# Patient Record
Sex: Female | Born: 1985 | Race: Black or African American | Hispanic: No | Marital: Single | Smoking: Never smoker
Health system: Southern US, Community
[De-identification: ages and names within clinical notes are randomized; demographics above are authoritative.]

## PROBLEM LIST (undated history)

## (undated) ENCOUNTER — Emergency Department (HOSPITAL_COMMUNITY): Admission: EM | Payer: Self-pay | Source: Home / Self Care

## (undated) HISTORY — PX: TUBAL LIGATION: SHX77

---

## 2010-10-25 ENCOUNTER — Emergency Department (HOSPITAL_BASED_OUTPATIENT_CLINIC_OR_DEPARTMENT_OTHER)
Admission: EM | Admit: 2010-10-25 | Discharge: 2010-10-25 | Payer: Self-pay | Source: Home / Self Care | Admitting: Emergency Medicine

## 2010-10-28 ENCOUNTER — Ambulatory Visit
Admission: RE | Admit: 2010-10-28 | Discharge: 2010-10-28 | Payer: Self-pay | Source: Home / Self Care | Attending: Family Medicine | Admitting: Family Medicine

## 2010-10-28 DIAGNOSIS — M79609 Pain in unspecified limb: Secondary | ICD-10-CM | POA: Insufficient documentation

## 2010-11-26 NOTE — Assessment & Plan Note (Signed)
Summary: NP/LP   Vital Signs:  Patient profile:   25 year old female Height:      61 inches (154.94 cm) Weight:      118.2 pounds (53.73 kg) BMI:     22.41 Temp:     97.9 degrees F (36.61 degrees C) oral Pulse rate:   65 / minute BP sitting:   110 / 72  (right arm)  Vitals Entered By: Baxter Hire) (October 28, 2010 9:32 AM) CC: new patient / right leg,foot and knee Pain Assessment Patient in pain? yes     Location: right leg Intensity: 4 Type: tingling Nutritional Status BMI of 19 -24 = normal  Does patient need assistance? Functional Status Self care Ambulation Normal   CC:  new patient / right leg and foot and knee.  History of Present Illness: 25 yo F here with right lower leg numbness and pain  Patient reports she woke up last thursday with a sensation of numbness from right knee distally Does not recall any injury or any increase in activity. Numbness on top of foot and behind right calf mostly. Feels heavy and weak also No problems with left lower leg. No prior issues with right leg. Pain in foot is worse with driving but numbness and weakness are predominant symptoms. Did wear heels to work the day prior to this starting but she usually wears heels. Taking ibuprofen. Went to ED and had doppler u/s which was negative for a DVT. Her symptoms are much better since this day - greater than 50% improvement. Had a cold about 1-2 weeks ago. No FH of autoimmune disease/nervous system issues.  Habits & Providers  Alcohol-Tobacco-Diet     Alcohol drinks/day: occassionally     Alcohol type: wine     Tobacco Status: never  Problems Prior to Update: None  Medications Prior to Update: 1)  None  Allergies (verified): 1)  ! Pcn  Family History: + DM mother + HTN grandmother negative for heart disease  Social History: nonsmoker social drinker works at [employer] Status:  never  Physical Exam  General:   Well-developed,well-nourished,in no acute distress; alert,appropriate and cooperative throughout examination Msk:  Back: No gross deformity. No midline TTP.  Mild left paraspinal lumbar TTP.   FROM Strength 4/5 with right foot dorsiflexion and plantarflexion, knee extension and flexion.  5/5 all other BLE muscle groups. MSRs 2+ and equal in bilateral patellar and achilles tendons. sensation diminished on right from knee distally compared to left.  Right leg: No gross deformity.  No swelling, warmth, cords palpated. No focal TTP throughout lower leg. Able to do heel raises without difficulty Ambulates without limp or abnormal gait. Negative tinels over head of fibula.   Impression & Recommendations:  Problem # 1:  LEG PAIN, RIGHT (ICD-729.5) Assessment New Reassured patient regarding negative ultrasound.  Pain and numbness has also improved quite a bit over past few days and would expect this to do so.  No injury to low back and exam not consistent with radiculopathy.  No evidence of compression at fibular head on exam.  While with specific muscle testing she had some weakness through distal right leg, she was able to do heel raises.  Exam not consistent with a specific nerve or dermatomal distribution.  Only one side which makes other inflammatory neuropathies less common though still possible.  Advised patient to monitor to see if this does continue to improve over the next week.  Do not think there is an  obvious musculoskeletal cause of her numbness and pain.  Advised her to call me after a week if the same or worsening - at which point would refer to neurology for further assessment.   Orders Added: 1)  New Patient Level III [16109]

## 2011-01-04 LAB — CBC
Hemoglobin: 11.9 g/dL — ABNORMAL LOW (ref 12.0–15.0)
MCH: 26.7 pg (ref 26.0–34.0)
MCV: 80.4 fL (ref 78.0–100.0)
Platelets: 328 10*3/uL (ref 150–400)
RBC: 4.45 MIL/uL (ref 3.87–5.11)

## 2011-01-04 LAB — DIFFERENTIAL
Eosinophils Absolute: 0.1 10*3/uL (ref 0.0–0.7)
Eosinophils Relative: 2 % (ref 0–5)
Lymphs Abs: 1.5 10*3/uL (ref 0.7–4.0)
Monocytes Relative: 9 % (ref 3–12)

## 2011-01-04 LAB — BASIC METABOLIC PANEL
CO2: 24 mEq/L (ref 19–32)
Chloride: 108 mEq/L (ref 96–112)
Creatinine, Ser: 0.8 mg/dL (ref 0.4–1.2)
GFR calc Af Amer: >60 mL/min (ref >60)

## 2011-05-02 ENCOUNTER — Emergency Department (HOSPITAL_COMMUNITY)
Admission: EM | Admit: 2011-05-02 | Discharge: 2011-05-02 | Disposition: A | Discharge status: Home / Self Care | Payer: Self-pay | Attending: Emergency Medicine | Admitting: Emergency Medicine

## 2011-05-02 DIAGNOSIS — Z79899 Other long term (current) drug therapy: Secondary | ICD-10-CM | POA: Insufficient documentation

## 2011-05-02 DIAGNOSIS — E860 Dehydration: Secondary | ICD-10-CM | POA: Insufficient documentation

## 2011-05-02 DIAGNOSIS — R109 Unspecified abdominal pain: Secondary | ICD-10-CM | POA: Insufficient documentation

## 2011-05-02 DIAGNOSIS — K5900 Constipation, unspecified: Secondary | ICD-10-CM | POA: Insufficient documentation

## 2011-05-02 DIAGNOSIS — O99891 Other specified diseases and conditions complicating pregnancy: Secondary | ICD-10-CM | POA: Insufficient documentation

## 2011-05-02 LAB — URINALYSIS, ROUTINE W REFLEX MICROSCOPIC
Bilirubin Urine: NEGATIVE
Glucose, UA: NEGATIVE mg/dL
Hgb urine dipstick: NEGATIVE
Ketones, ur: 15 mg/dL — AB
Specific Gravity, Urine: 1.006 (ref 1.005–1.030)
Urobilinogen, UA: 0.2 mg/dL (ref 0.0–1.0)
pH: 6.5 (ref 5.0–8.0)

## 2011-07-25 ENCOUNTER — Emergency Department (HOSPITAL_BASED_OUTPATIENT_CLINIC_OR_DEPARTMENT_OTHER)
Admission: EM | Admit: 2011-07-25 | Discharge: 2011-07-25 | Disposition: A | Discharge status: Home / Self Care | Payer: Medicaid Other | Attending: Emergency Medicine | Admitting: Emergency Medicine

## 2011-07-25 ENCOUNTER — Encounter: Payer: Self-pay | Admitting: *Deleted

## 2011-07-25 DIAGNOSIS — R1031 Right lower quadrant pain: Secondary | ICD-10-CM | POA: Insufficient documentation

## 2011-07-25 DIAGNOSIS — O2390 Unspecified genitourinary tract infection in pregnancy, unspecified trimester: Secondary | ICD-10-CM | POA: Insufficient documentation

## 2011-07-25 DIAGNOSIS — N390 Urinary tract infection, site not specified: Secondary | ICD-10-CM

## 2011-07-25 DIAGNOSIS — O479 False labor, unspecified: Secondary | ICD-10-CM | POA: Insufficient documentation

## 2011-07-25 LAB — URINALYSIS, ROUTINE W REFLEX MICROSCOPIC
Bilirubin Urine: NEGATIVE
Specific Gravity, Urine: 1.011 (ref 1.005–1.030)
pH: 8 (ref 5.0–8.0)

## 2011-07-25 LAB — URINE MICROSCOPIC-ADD ON

## 2011-07-25 MED ORDER — ACETAMINOPHEN 325 MG PO TABS
650.0000 mg | ORAL_TABLET | Freq: Once | ORAL | Status: AC
  Administered 2011-07-25: 650 mg via ORAL
  Filled 2011-07-25: qty 2

## 2011-07-25 MED ORDER — CEPHALEXIN 500 MG PO CAPS: 500.0000 mg | ORAL_CAPSULE | Freq: Four times a day (QID) | ORAL | Status: AC

## 2011-07-25 MED ORDER — DEXTROSE 5 % IV SOLN
1.0000 g | INTRAVENOUS | Status: DC
  Administered 2011-07-25: 1 g via INTRAVENOUS
  Filled 2011-07-25: qty 1

## 2011-07-25 MED ORDER — SODIUM CHLORIDE 0.9 % IV BOLUS (SEPSIS): 1000.0000 mL | Freq: Once | INTRAVENOUS | Status: DC

## 2011-07-25 NOTE — ED Notes (Signed)
Pt states that she had  sharp lower right abd pain and one episode of vomiting  last Pm around 2000 pain resided 2 hours later, and returned at 0200 accompanied by chills

## 2011-07-25 NOTE — ED Notes (Signed)
Pt is [redacted] weeks pregnant. 

## 2011-07-25 NOTE — ED Provider Notes (Signed)
History     CSN: [id number] Arrival date & time: 07/25/2011  2:41 AM  Chief Complaint  Patient presents with  . Abdominal Pain    (Consider location/radiation/quality/duration/timing/severity/associated sxs/prior treatment) HPI Patient is a G2 P1 currently [redacted] weeks pregnant who presents with mild sharp right lower quadrant pain since this evening.  She reports nausea and vomiting earlier today.  Denies hematemesis.  She denies diarrhea fever or chills.  She reports no loss of fluid or vaginal bleeding or spotting.  She continues to feel the field the child move at this time.  Her symptoms came on and they were brought.  They come and go.  They last seconds.  Her symptoms are described as moderate in severity. nothing improves or worsens her symptoms History reviewed. No pertinent past medical history.  History reviewed. No pertinent past surgical history.  History reviewed. No pertinent family history.  History  Substance Use Topics  . Smoking status: Never Smoker   . Smokeless tobacco: Not on file  . Alcohol Use: No    OB History    Grav Para Term Preterm Abortions TAB SAB Ect Mult Living   2 1              Review of Systems  All other systems reviewed and are negative.    Allergies  Penicillins  Home Medications   Current Outpatient Rx  Name Route Sig Dispense Refill  . PRENATAL PLUS 27-1 MG PO TABS Oral Take 1 tablet by mouth daily.        BP 124/64  Pulse 95  Temp(Src) 98.6 F (37 C) (Oral)  Resp 16  SpO2 96%  Physical Exam  Constitutional: She is oriented to person, place, and time. She appears well-developed and well-nourished.  HENT:  Head: Normocephalic.  Eyes: EOM are normal.  Neck: Normal range of motion.  Cardiovascular: Normal rate.   Pulmonary/Chest: Effort normal.  Abdominal: Soft.       Gravid uterus.  No significant tenderness on exam.  No guarding or rebound or peritoneal signs.  Musculoskeletal: Normal range of motion.  Neurological:  She is alert and oriented to person, place, and time.  Skin: Skin is warm and dry.  Psychiatric: She has a normal mood and affect.    ED Course  Procedures (including critical care time)  Labs Reviewed  URINALYSIS, ROUTINE W REFLEX MICROSCOPIC - Abnormal; Notable for the following:    Appearance CLOUDY (*)    Hgb urine dipstick SMALL (*)    Protein, ur 30 (*)    Leukocytes, UA LARGE (*)    All other components within normal limits  URINE MICROSCOPIC-ADD ON - Abnormal; Notable for the following:    Bacteria, UA FEW (*)    All other components within normal limits  URINE CULTURE   No results found.   1. Urinary tract infection   2. Labor, false (Braxton-Hicks)       MDM  Low suspicion for renal colic.  Obtain a urinalysis at this time patient is then placed on the fetal monitor as well tocometer.  Your heart rates in the 140s to 150s.  She does not have evidence of contractions at this time.  Antiemetics now will bolus with 1 L of fluid.  We'll reevaluate the patient and continue to monitor fetal heart rhythm and tocometetry  5:52 AM Urinary tract infection noted.  Urine culture sent.  Patient started on IV Rocephin.  While in the emergency department she developed a temperature of 100.8.  She does appear to be contracting at this time with contractions every 4-6 minutes.  Examination of her cervix demonstrates a fingertip cervix without effacement.  We'll contact her OB/GYN for further recommendations at this time  6:04 AM Spoke with Dr Leida Lauth of [facility]. We discussed the case. He agreed with management and no indication for hospitalization. Pt has been instructed to call her OB/GYN's on Monday if she continues to have symptoms.  Her urine has been cultured she's been given a dose of ceftriaxone.  She'll be sent home on 10 day course of Keflex.       Lyanne Co, MD 07/25/11 [phone]

## 2011-07-26 ENCOUNTER — Telehealth (HOSPITAL_BASED_OUTPATIENT_CLINIC_OR_DEPARTMENT_OTHER): Payer: Self-pay | Admitting: Emergency Medicine

## 2011-07-27 LAB — URINE CULTURE

## 2012-06-29 IMAGING — US US EXTREM LOW VENOUS*R*
1 series · 14 of 17 positions shown · non-contrast
Comparison: None.

CLINICAL DATA: Right calf pain and numbness.

RIGHT LOWER EXTREMITY VENOUS DUPLEX ULTRASOUND
TECHNIQUE: Gray-scale sonography with graded compression, as well
as color Doppler and duplex ultrasound were performed to evaluate
the deep venous system of the lower extremity from the level of the
common femoral vein through the popliteal and proximal calf veins.
Spectral Doppler was utilized to evaluate flow at rest and with
distal augmentation maneuvers.

[Series 1: us extrem low venous*right* · 14 of 17 slices shown]
[im 1/17]
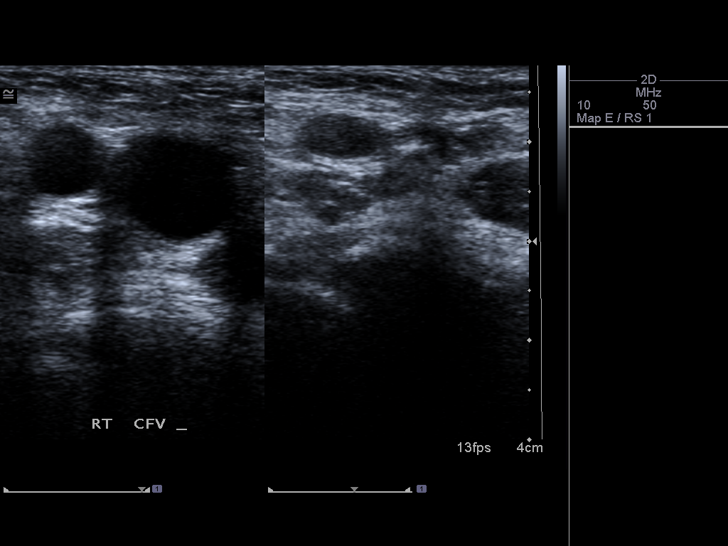
[im 2/17]
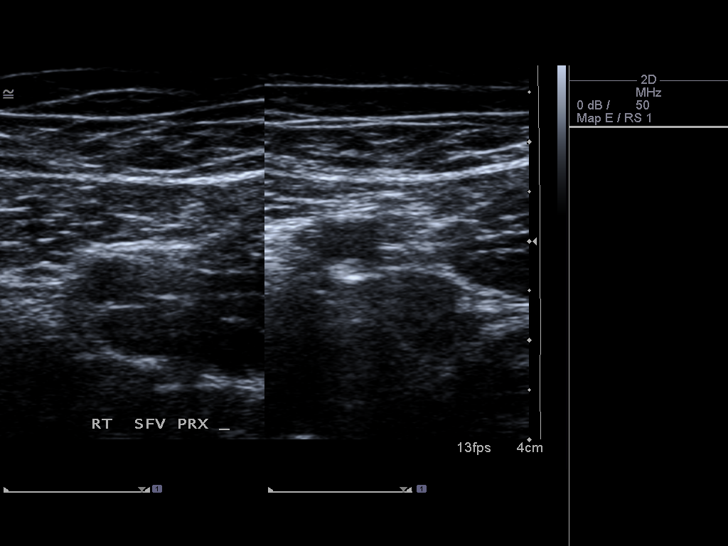
[im 4/17]
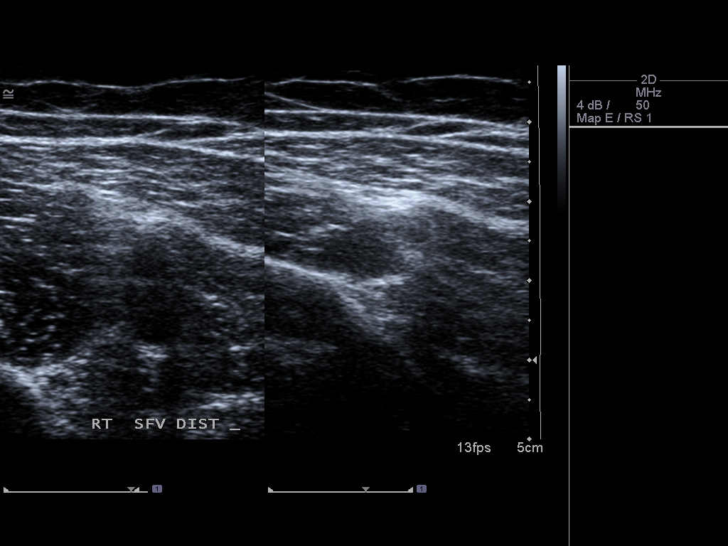
[im 5/17]
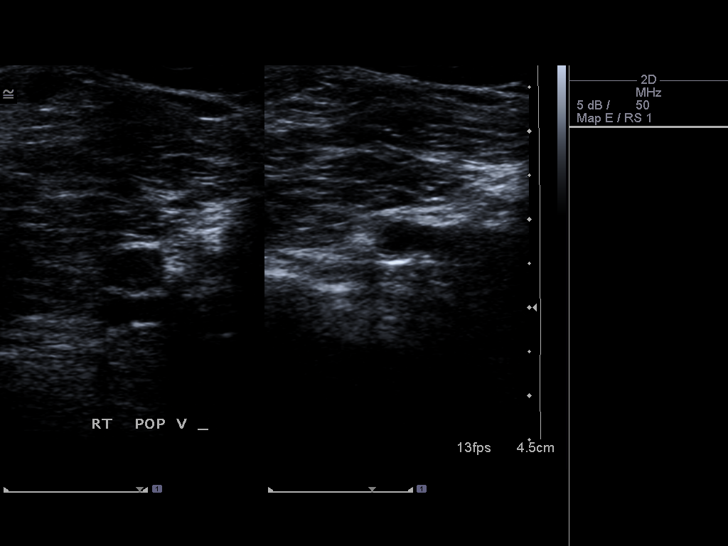
[im 6/17]
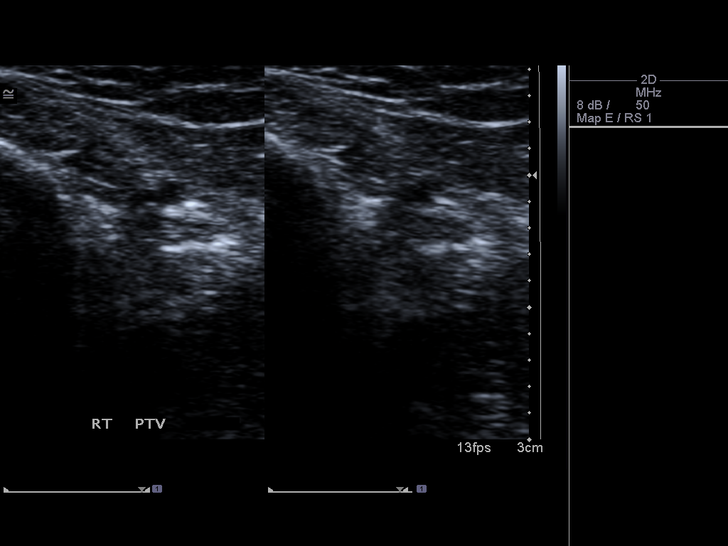
[im 7/17]
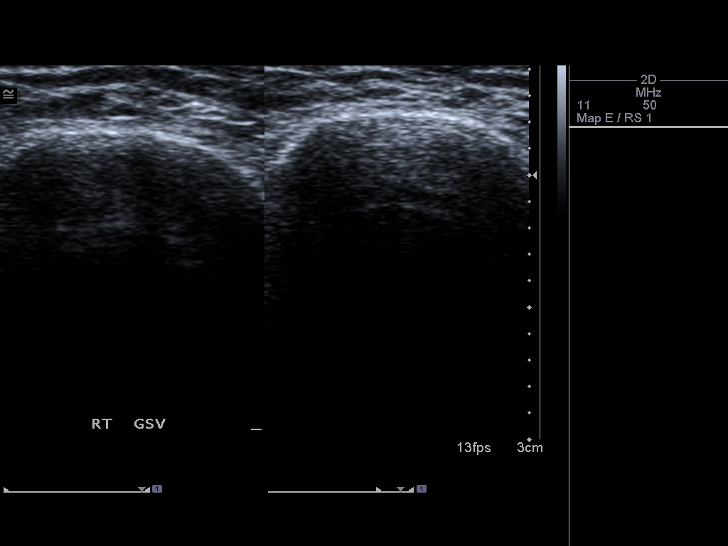
[im 8/17]
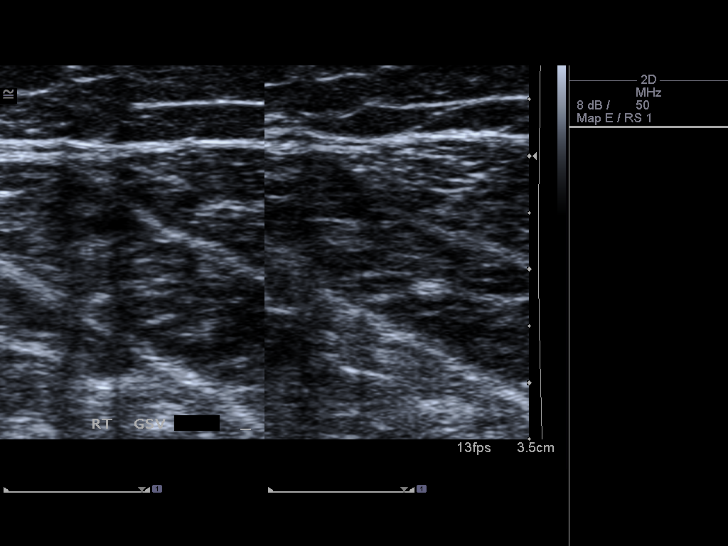
[im 10/17]
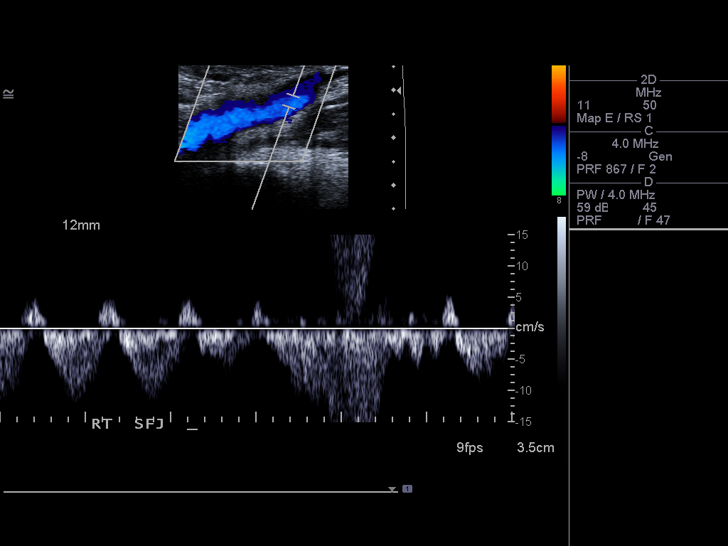
[im 11/17]
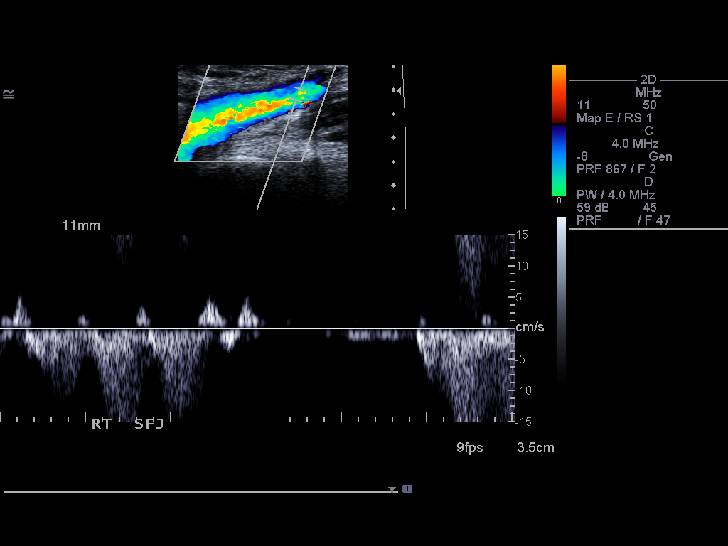
[im 12/17]
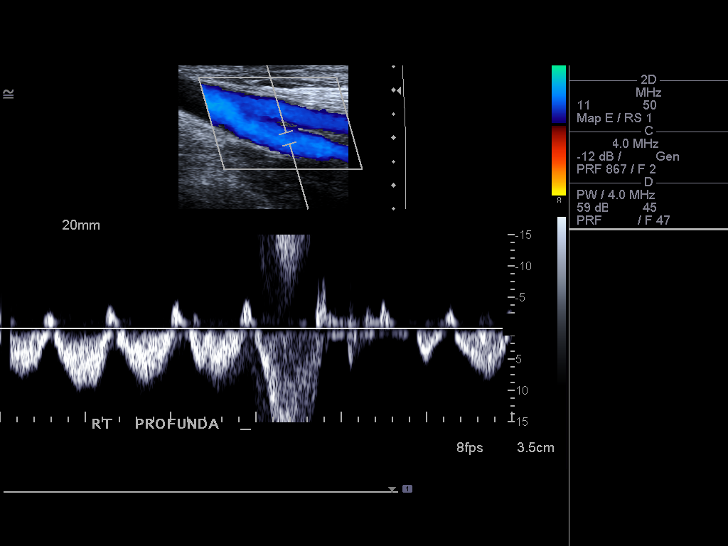
[im 13/17]
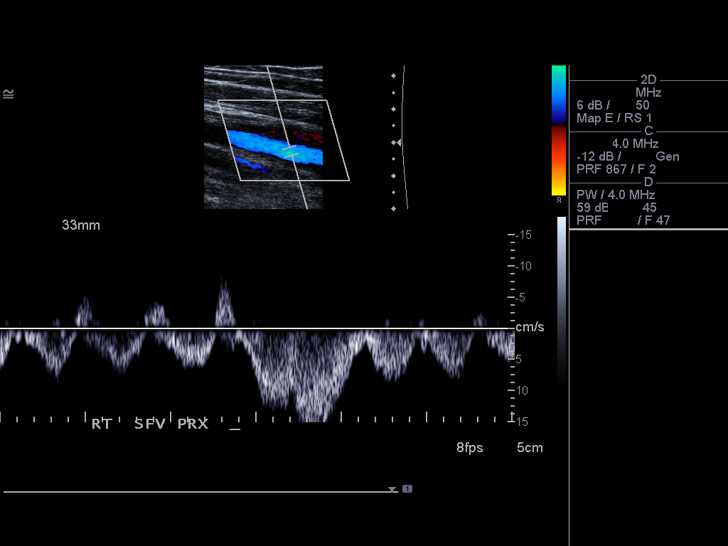
[im 14/17]
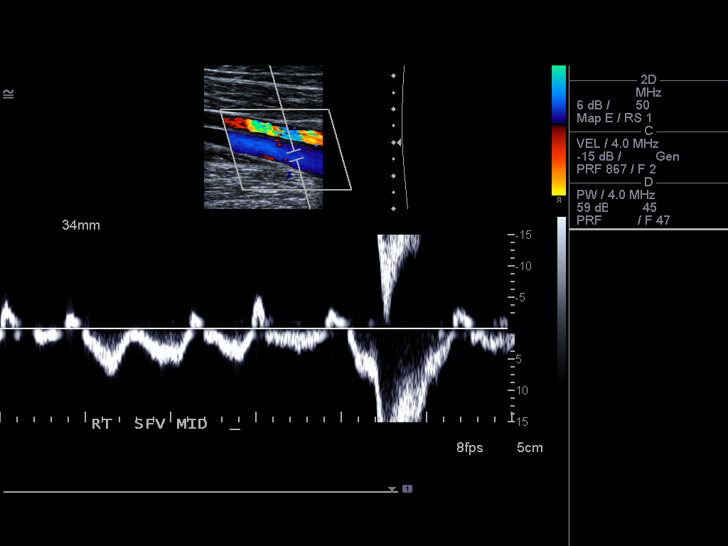
[im 16/17]
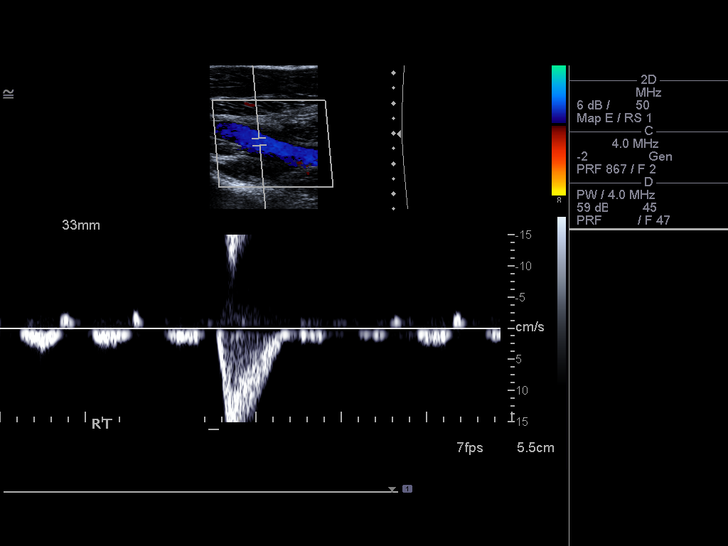
[im 17/17]
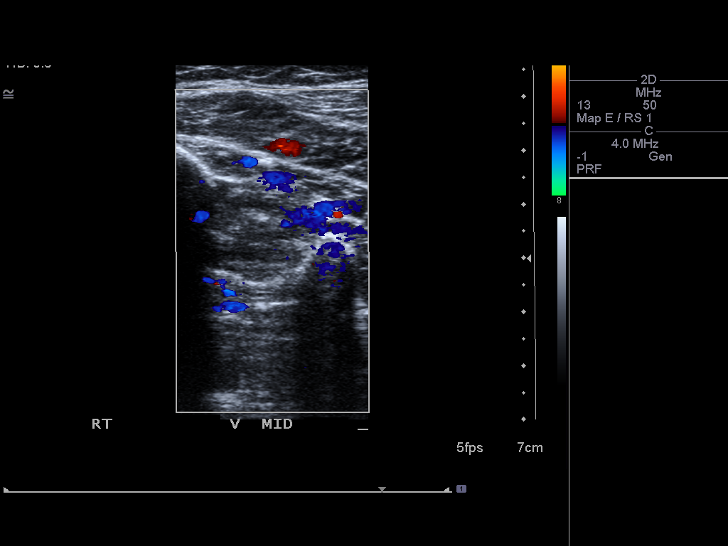

[14 of 17 positions shown; findings below may reference images not displayed]

FINDINGS: Normal compressibility of the common femoral,
superficial femoral, and popliteal veins is demonstrated, as well
as the visualized proximal calf veins.  No filling defects to
suggest DVT on grayscale or color Doppler imaging.  Doppler
waveforms show normal direction of venous flow, normal respiratory
phasicity and response to augmentation.
IMPRESSION: No evidence of right lower extremity deep vein thrombosis.

## 2014-07-09 ENCOUNTER — Encounter (HOSPITAL_BASED_OUTPATIENT_CLINIC_OR_DEPARTMENT_OTHER): Payer: Self-pay | Admitting: Emergency Medicine

## 2014-07-09 ENCOUNTER — Emergency Department (HOSPITAL_BASED_OUTPATIENT_CLINIC_OR_DEPARTMENT_OTHER)
Admission: EM | Admit: 2014-07-09 | Discharge: 2014-07-10 | Disposition: A | Discharge status: Home / Self Care | Payer: Medicaid Other | Attending: Emergency Medicine | Admitting: Emergency Medicine

## 2014-07-09 DIAGNOSIS — R509 Fever, unspecified: Secondary | ICD-10-CM | POA: Diagnosis not present

## 2014-07-09 DIAGNOSIS — R000 Tachycardia, unspecified: Secondary | ICD-10-CM | POA: Diagnosis not present

## 2014-07-09 DIAGNOSIS — Z3202 Encounter for pregnancy test, result negative: Secondary | ICD-10-CM | POA: Insufficient documentation

## 2014-07-09 DIAGNOSIS — R112 Nausea with vomiting, unspecified: Secondary | ICD-10-CM | POA: Insufficient documentation

## 2014-07-09 DIAGNOSIS — R197 Diarrhea, unspecified: Secondary | ICD-10-CM | POA: Diagnosis not present

## 2014-07-09 DIAGNOSIS — R42 Dizziness and giddiness: Secondary | ICD-10-CM | POA: Insufficient documentation

## 2014-07-09 DIAGNOSIS — Z880 Allergy status to penicillin: Secondary | ICD-10-CM | POA: Diagnosis not present

## 2014-07-09 LAB — URINALYSIS, ROUTINE W REFLEX MICROSCOPIC
GLUCOSE, UA: NEGATIVE mg/dL
KETONES UR: 15 mg/dL — AB
LEUKOCYTES UA: NEGATIVE
Nitrite: NEGATIVE
PROTEIN: 30 mg/dL — AB
Specific Gravity, Urine: 1.035 — ABNORMAL HIGH (ref 1.005–1.030)
UROBILINOGEN UA: 1 mg/dL (ref 0.0–1.0)
pH: 6 (ref 5.0–8.0)

## 2014-07-09 LAB — URINE MICROSCOPIC-ADD ON

## 2014-07-09 LAB — PREGNANCY, URINE: Preg Test, Ur: NEGATIVE

## 2014-07-09 MED ORDER — ONDANSETRON HCL 4 MG/2ML IJ SOLN
4.0000 mg | Freq: Once | INTRAMUSCULAR | Status: AC
  Administered 2014-07-09: 4 mg via INTRAVENOUS
  Filled 2014-07-09: qty 2

## 2014-07-09 MED ORDER — ACETAMINOPHEN 325 MG PO TABS
650.0000 mg | ORAL_TABLET | Freq: Once | ORAL | Status: AC
  Administered 2014-07-09: 650 mg via ORAL
  Filled 2014-07-09: qty 2

## 2014-07-09 MED ORDER — ONDANSETRON HCL 4 MG PO TABS: 4.0000 mg | ORAL_TABLET | Freq: Four times a day (QID) | ORAL | Status: AC

## 2014-07-09 MED ORDER — SODIUM CHLORIDE 0.9 % IV BOLUS (SEPSIS)
1000.0000 mL | Freq: Once | INTRAVENOUS | Status: AC
  Administered 2014-07-09: 1000 mL via INTRAVENOUS

## 2014-07-09 NOTE — Discharge Instructions (Signed)
Return to the ED with any concerns including vomiting and not able to keep down liquids, abdominal pain- especialy if it localizes to the right lower abdomen, fainting, decreased level of alertness/lethargy, or any other alarming symptoms

## 2014-07-09 NOTE — ED Notes (Signed)
Woke with dizziness. Vomiting, diarrhea, chills, fever 103.

## 2014-07-09 NOTE — ED Notes (Signed)
Pt discharged to home with family. NAD.  

## 2014-07-09 NOTE — ED Provider Notes (Signed)
CSN: [id number]     Arrival date & time 07/09/14  1926 History  This chart was scribed for Carrie Chick, MD by Roxy Cedar, ED Scribe. This patient was seen in room MH02/MH02 and the patient's care was started at 10:13 PM.   Chief Complaint  Patient presents with  . Dizziness   Patient is a 28 y.o. female presenting with dizziness and fever. The history is provided by the patient. No language interpreter was used.  Dizziness Severity:  Mild Onset quality:  Gradual Duration:  1 day Timing:  Sporadic Associated symptoms: diarrhea and vomiting   Fever Max temp prior to arrival:  103 Temp source:  Oral Severity:  Moderate Onset quality:  Sudden Duration:  1 day Associated symptoms: chills, diarrhea and vomiting     HPI Comments: Tarae Wooden is a 28 y.o. female who presents to the Emergency Department complaining of dizziness, chills, fever, nausea and vomiting that began at 6 AM this morning. She states that she has had 4 episodes of emesis today. She states that the vomit was initially clear and progressively turned green with each episode of emesis. Patient states that her maximum fever today was 103 degrees F. Patient also complains of body aches to her stomach, back and head. She states that she took ibuprofen at 3 PM today and was able to tolerate it without vomiting. She is able to keep down water. Patient denies any sick contacts or recent travel.  History reviewed. No pertinent past medical history. Past Surgical History  Procedure Laterality Date  . Tubal ligation     No family history on file. History  Substance Use Topics  . Smoking status: Never Smoker   . Smokeless tobacco: Not on file  . Alcohol Use: No   OB History   Grav Para Term Preterm Abortions TAB SAB Ect Mult Living   2 1             Review of Systems  Constitutional: Positive for fever and chills.  Gastrointestinal: Positive for vomiting and diarrhea.  Neurological: Positive for  dizziness.  All other systems reviewed and are negative.  Allergies  Penicillins  Home Medications   Prior to Admission medications   Medication Sig Start Date End Date Taking? Authorizing Provider  ondansetron (ZOFRAN) 4 MG tablet Take 1 tablet (4 mg total) by mouth every 6 (six) hours. 07/09/14   Carrie Chick, MD  prenatal vitamin w/FE, FA (PRENATAL 1 + 1) 27-1 MG TABS Take 1 tablet by mouth daily.      Historical Provider, MD   Triage Vitals: BP 128/80  Pulse 112  Temp(Src) 100.2 F (37.9 C) (Oral)  Resp 20  Ht  (1.549 m)  Wt 138 lb (62.596 kg)  BMI 26.09 kg/m2  SpO2 98%  LMP 07/08/2014  Breastfeeding? Unknown  Physical Exam  Nursing note and vitals reviewed. Constitutional: She is oriented to person, place, and time. She appears well-developed and well-nourished. No distress.  HENT:  Head: Normocephalic and atraumatic.  Mouth/Throat: Oropharynx is clear and moist.  Eyes: Conjunctivae and EOM are normal. Pupils are equal, round, and reactive to light.  Neck: Neck supple. No tracheal deviation present.  Cardiovascular: Normal rate.   Pulmonary/Chest: Effort normal. No respiratory distress.  Brisk capillary refill.  Abdominal: There is no tenderness.  Musculoskeletal: Normal range of motion.  Neurological: She is alert and oriented to person, place, and time.  Skin: Skin is warm and dry.  Psychiatric: She has a normal  mood and affect. Her behavior is normal.  note- abdomen- soft, NT, ND, NABS, OP- MMM, Lungs- CTAB, no wheezing or rhonchi ED Course  Procedures (including critical care time)  DIAGNOSTIC STUDIES: Oxygen Saturation is 98% on RA, normal by my interpretation.    COORDINATION OF CARE: 10:15 PM- Discussed plans to order diagnostic lab work. Will give Zofran and Tylenol. Pt advised of plan for treatment and pt agrees.  Labs Review Labs Reviewed  URINALYSIS, ROUTINE W REFLEX MICROSCOPIC - Abnormal; Notable for the following:    Color, Urine AMBER  (*)    APPearance CLOUDY (*)    Specific Gravity, Urine 1.035 (*)    Hgb urine dipstick LARGE (*)    Bilirubin Urine SMALL (*)    Ketones, ur 15 (*)    Protein, ur 30 (*)    All other components within normal limits  PREGNANCY, URINE  URINE MICROSCOPIC-ADD ON   Imaging Review No results found.   EKG Interpretation None     MDM   Final diagnoses:  Nausea vomiting and diarrhea    Pt presenting with acute onset of nausea/vomiting/diarrhea today. Benign abdominal exam.  Pt appears well hydrated, she has low grade fever and mild tachycardia- which improved after IV fluids.  Pt able to tolerate po fluids.  Given rx for zofran for home use.  Doubt acute intraabdominal process warranting further evaluation or treatment in the ED at this time.  Discharged with strict return precautions.  Pt agreeable with plan.  I personally performed the services described in this documentation, which was scribed in my presence. The recorded information has been reviewed and is accurate.  Carrie Chick, MD 07/09/14 [phone]

## 2014-07-09 NOTE — ED Notes (Signed)
Water and ginger ale given to patient.

## 2014-08-26 ENCOUNTER — Encounter (HOSPITAL_BASED_OUTPATIENT_CLINIC_OR_DEPARTMENT_OTHER): Payer: Self-pay | Admitting: Emergency Medicine

## 2017-05-14 ENCOUNTER — Encounter (HOSPITAL_BASED_OUTPATIENT_CLINIC_OR_DEPARTMENT_OTHER): Payer: Self-pay | Admitting: Emergency Medicine

## 2017-05-14 ENCOUNTER — Emergency Department (HOSPITAL_BASED_OUTPATIENT_CLINIC_OR_DEPARTMENT_OTHER)
Admission: EM | Admit: 2017-05-14 | Discharge: 2017-05-14 | Disposition: A | Discharge status: Home / Self Care | Payer: Self-pay | Attending: Emergency Medicine | Admitting: Emergency Medicine

## 2017-05-14 ENCOUNTER — Emergency Department (HOSPITAL_BASED_OUTPATIENT_CLINIC_OR_DEPARTMENT_OTHER): Payer: Self-pay

## 2017-05-14 DIAGNOSIS — Z79899 Other long term (current) drug therapy: Secondary | ICD-10-CM | POA: Insufficient documentation

## 2017-05-14 DIAGNOSIS — N83201 Unspecified ovarian cyst, right side: Secondary | ICD-10-CM | POA: Insufficient documentation

## 2017-05-14 DIAGNOSIS — R102 Pelvic and perineal pain: Secondary | ICD-10-CM

## 2017-05-14 LAB — CBC
HEMATOCRIT: 31.2 % — AB (ref 36.0–46.0)
Hemoglobin: 10 g/dL — ABNORMAL LOW (ref 12.0–15.0)
MCH: 25 pg — AB (ref 26.0–34.0)
MCHC: 32.1 g/dL (ref 30.0–36.0)
MCV: 78 fL (ref 78.0–100.0)
Platelets: 266 10*3/uL (ref 150–400)
RBC: 4 MIL/uL (ref 3.87–5.11)
RDW: 16.7 % — AB (ref 11.5–15.5)
WBC: 5.4 10*3/uL (ref 4.0–10.5)

## 2017-05-14 LAB — COMPREHENSIVE METABOLIC PANEL
ALBUMIN: 3.9 g/dL (ref 3.5–5.0)
ALK PHOS: 53 U/L (ref 38–126)
ALT: 13 U/L — ABNORMAL LOW (ref 14–54)
ANION GAP: 6 (ref 5–15)
AST: 24 U/L (ref 15–41)
BILIRUBIN TOTAL: 0.1 mg/dL — AB (ref 0.3–1.2)
BUN: 14 mg/dL (ref 6–20)
CALCIUM: 8.8 mg/dL — AB (ref 8.9–10.3)
CO2: 24 mmol/L (ref 22–32)
Chloride: 107 mmol/L (ref 101–111)
Creatinine, Ser: 0.87 mg/dL (ref 0.44–1.00)
GFR calc non Af Amer: >60 mL/min (ref >60)
Glucose, Bld: 87 mg/dL (ref 65–99)
POTASSIUM: 4 mmol/L (ref 3.5–5.1)
Sodium: 137 mmol/L (ref 135–145)
TOTAL PROTEIN: 8.2 g/dL — AB (ref 6.5–8.1)

## 2017-05-14 LAB — URINALYSIS, ROUTINE W REFLEX MICROSCOPIC
Bilirubin Urine: NEGATIVE
Glucose, UA: NEGATIVE mg/dL
Hgb urine dipstick: NEGATIVE
KETONES UR: NEGATIVE mg/dL
LEUKOCYTES UA: NEGATIVE
NITRITE: NEGATIVE
PROTEIN: NEGATIVE mg/dL
Specific Gravity, Urine: 1.021 (ref 1.005–1.030)
pH: 6 (ref 5.0–8.0)

## 2017-05-14 LAB — WET PREP, GENITAL
Sperm: NONE SEEN
Trich, Wet Prep: NONE SEEN
YEAST WET PREP: NONE SEEN

## 2017-05-14 LAB — LIPASE, BLOOD: Lipase: 32 U/L (ref 11–51)

## 2017-05-14 LAB — RAPID HIV SCREEN (HIV 1/2 AB+AG)
HIV 1/2 Antibodies: NONREACTIVE
HIV-1 P24 Antigen - HIV24: NONREACTIVE

## 2017-05-14 LAB — PREGNANCY, URINE: PREG TEST UR: NEGATIVE

## 2017-05-14 MED ORDER — NAPROXEN 500 MG PO TABS: 500.0000 mg | ORAL_TABLET | Freq: Two times a day (BID) | ORAL | 0 refills | Status: AC

## 2017-05-14 NOTE — ED Notes (Signed)
EDP into room, at [facility].  ?

## 2017-05-14 NOTE — ED Notes (Signed)
No changes, pt alert, NAD, calm, interactive[facility]

## 2017-05-14 NOTE — Discharge Instructions (Signed)
We saw you in the ER for pelvic pain. We treated you for possible STD exposures. Rest of the results show: There is no evidence of Urinary tract infection and the pregnancy test is negative as well.  PLEASE PRACTICE SAFE SEX. PLEASE INFORM YOUR PARTNER TO GET CHECKED AND TREATED FOR STD AS WELL.

## 2017-05-14 NOTE — ED Notes (Signed)
Alert, NAD, calm, interactive, resps e/u, speaking in clear complete sentences, no dyspnea noted, skin W&D, c/o upper abd pain only, (denies: fever, NVD, sob, urinary or vaginal sx, dizziness or visual changes).

## 2017-05-14 NOTE — ED Notes (Signed)
lavendar tube sent to lab for CBC

## 2017-05-14 NOTE — ED Provider Notes (Signed)
[facility] Provider Note   CSN: [id number] Arrival date & time: 05/14/17  1756  By signing my name below, I, Carrie Mcdonald, attest that this documentation has been prepared under the direction and in the presence of Derwood Kaplan, MD. Electronically Signed: Deland Mcdonald, ED Scribe. 05/14/17. 8:53 PM.  History   Chief Complaint Chief Complaint  Patient presents with  . Abdominal Pain   The history is provided by the patient. No language interpreter was used.   HPI Comments: Carrie Mcdonald is a 31 y.o. female who presents to the Emergency Department complaining of gradually worsening, intermittent 4/5 central abdominal pain with associated chills and nausea that began yesterday. She also reports myalgias that began a few hours ago. The pt states that her pain radiates to her midline back. The pt has taken tylenol with inadequate relief. She has no h/x of abdominal surgeries, with the exception of a tubal ligation. She denies a PMHx of DM, HTN, and nephrolithiasis. She also denies a possibility of pregnancy, STD contraction, and recent sexual partners. The pt denies vomiting, dysuria, hematuria, vaginal bleeding, and vaginal discharge.  History reviewed. No pertinent past medical history.  Patient Active Problem List   Diagnosis Date Noted  . LEG PAIN, RIGHT 10/28/2010    Past Surgical History:  Procedure Laterality Date  . TUBAL LIGATION      OB History    Gravida Para Term Preterm AB Living   2 1           SAB TAB Ectopic Multiple Live Births                   Home Medications    Prior to Admission medications   Medication Sig Start Date End Date Taking? Authorizing Provider  naproxen (NAPROSYN) 500 MG tablet Take 1 tablet (500 mg total) by mouth 2 (two) times daily. 05/14/17   Derwood Kaplan, MD  ondansetron (ZOFRAN) 4 MG tablet Take 1 tablet (4 mg total) by mouth every 6 (six) hours. 07/09/14   Jerelyn Scott, MD  prenatal vitamin w/FE, FA  (PRENATAL 1 + 1) 27-1 MG TABS Take 1 tablet by mouth daily.      [provider]    Family History No family history on file.  Social History Social History  Substance Use Topics  . Smoking status: Never Smoker  . Smokeless tobacco: Never Used  . Alcohol use Yes     Comment: social     Allergies   Penicillins   Review of Systems Review of Systems  Gastrointestinal: Positive for abdominal pain. Negative for vomiting.  Genitourinary: Negative for dysuria.  Musculoskeletal: Positive for myalgias.     Physical Exam Updated Vital Signs BP 124/83 (BP Location: Right Arm)   Pulse (!) 54   Temp 98.9 F (37.2 C) (Oral)   Resp 17   Ht 5\' 1"  (1.549 m)   Wt 58.1 kg (128 lb)   LMP 04/16/2017   SpO2 100%   BMI 24.19 kg/m   Physical Exam  Constitutional: She is oriented to person, place, and time. She appears well-developed and well-nourished.  HENT:  Head: Normocephalic.  Mouth/Throat: Oropharynx is clear and moist.  Eyes: Pupils are equal, round, and reactive to light. EOM are normal.  Neck: Normal range of motion.  Cardiovascular: Normal rate, regular rhythm, normal heart sounds and intact distal pulses.  Exam reveals no gallop and no friction rub.   No murmur heard. Pulmonary/Chest: Effort normal and breath sounds normal. No  respiratory distress. She has no wheezes. She has no rales.  Abdominal: Bowel sounds are normal. She exhibits no distension.  Genitourinary: Uterus normal. Vaginal discharge found.  Genitourinary Comments: No cervical motion tenderness  Musculoskeletal: Normal range of motion.  Neurological: She is alert and oriented to person, place, and time.  Psychiatric: She has a normal mood and affect.  Nursing note and vitals reviewed.    ED Treatments / Results   DIAGNOSTIC STUDIES: Oxygen Saturation is 99% on RA, normal by my interpretation.   COORDINATION OF CARE: 8:28 PM-Discussed next steps with pt. Pt verbalized understanding and is  agreeable with the plan.   Labs (all labs ordered are listed, but only abnormal results are displayed) Labs Reviewed  WET PREP, GENITAL - Abnormal; Notable for the following:       Result Value   Clue Cells Wet Prep HPF POC PRESENT (*)    WBC, Wet Prep HPF POC MODERATE (*)    All other components within normal limits  COMPREHENSIVE METABOLIC PANEL - Abnormal; Notable for the following:    Calcium 8.8 (*)    Total Protein 8.2 (*)    ALT 13 (*)    Total Bilirubin 0.1 (*)    All other components within normal limits  CBC - Abnormal; Notable for the following:    Hemoglobin 10.0 (*)    HCT 31.2 (*)    MCH 25.0 (*)    RDW 16.7 (*)    All other components within normal limits  LIPASE, BLOOD  URINALYSIS, ROUTINE W REFLEX MICROSCOPIC  PREGNANCY, URINE  RAPID HIV SCREEN (HIV 1/2 AB+AG)  GC/CHLAMYDIA PROBE AMP ([place]) NOT AT [facility]    EKG  EKG Interpretation None       Radiology US Transvaginal Non-ob  Result Date: 05/14/2017 CLINICAL DATA:  Mid pelvic pain and nausea for 3 days EXAM: TRANSABDOMINAL AND TRANSVAGINAL ULTRASOUND OF PELVIS DOPPLER ULTRASOUND OF OVARIES TECHNIQUE: Both transabdominal and transvaginal ultrasound examinations of the pelvis were performed. Transabdominal technique was performed for global imaging of the pelvis including uterus, ovaries, adnexal regions, and pelvic cul-de-sac. It was necessary to proceed with endovaginal exam following the transabdominal exam to visualize the ovaries. Color and duplex Doppler ultrasound was utilized to evaluate blood flow to the ovaries. COMPARISON:  None. FINDINGS: Uterus Measurements: 9 x 5 x 5 cm. No fibroids or other mass visualized. Endometrium Thickness: 10 mm, overestimated due to small volume endometrial cavity fluid. No focal abnormality visualized. Right ovary Measurements: 42 x 35 x 39 mm. The ovary is larger due to a 3.1 cm peripherally hypervascular structure with internal mid level echoes. The margins appear  somewhat crenulated. This is likely a hemorrhagic corpus luteum. Left ovary Measurements: 28 x 19 x 17 mm. Normal appearance/no adnexal mass. Pulsed Doppler evaluation of both ovaries demonstrates normal low-resistance arterial and venous waveforms. Other findings Small volume simple appearing pelvic fluid. IMPRESSION: 1. 3.1 cm complex intra-ovarian cyst on the right, likely a hemorrhagic corpus luteum. Recommend follow-up in 6-12 weeks to document expected resolution. 2. Normal bilateral ovarian blood flow. Normal appearance of the uterus and left ovary. Electronically Signed   By: Marnee Spring M.D.   On: 05/14/2017 21:30   US Pelvis Complete  Result Date: 05/14/2017 CLINICAL DATA:  Mid pelvic pain and nausea for 3 days EXAM: TRANSABDOMINAL AND TRANSVAGINAL ULTRASOUND OF PELVIS DOPPLER ULTRASOUND OF OVARIES TECHNIQUE: Both transabdominal and transvaginal ultrasound examinations of the pelvis were performed. Transabdominal technique was performed for global imaging of  the pelvis including uterus, ovaries, adnexal regions, and pelvic cul-de-sac. It was necessary to proceed with endovaginal exam following the transabdominal exam to visualize the ovaries. Color and duplex Doppler ultrasound was utilized to evaluate blood flow to the ovaries. COMPARISON:  None. FINDINGS: Uterus Measurements: 9 x 5 x 5 cm. No fibroids or other mass visualized. Endometrium Thickness: 10 mm, overestimated due to small volume endometrial cavity fluid. No focal abnormality visualized. Right ovary Measurements: 42 x 35 x 39 mm. The ovary is larger due to a 3.1 cm peripherally hypervascular structure with internal mid level echoes. The margins appear somewhat crenulated. This is likely a hemorrhagic corpus luteum. Left ovary Measurements: 28 x 19 x 17 mm. Normal appearance/no adnexal mass. Pulsed Doppler evaluation of both ovaries demonstrates normal low-resistance arterial and venous waveforms. Other findings Small volume simple  appearing pelvic fluid. IMPRESSION: 1. 3.1 cm complex intra-ovarian cyst on the right, likely a hemorrhagic corpus luteum. Recommend follow-up in 6-12 weeks to document expected resolution. 2. Normal bilateral ovarian blood flow. Normal appearance of the uterus and left ovary. Electronically Signed   By: Marnee SpringJonathon  Watts M.D.   On: 05/14/2017 21:30   [place] Art/ven Flow Abd Pelv Doppler  Result Date: 05/14/2017 CLINICAL DATA:  Mid pelvic pain and nausea for 3 days EXAM: TRANSABDOMINAL AND TRANSVAGINAL ULTRASOUND OF PELVIS DOPPLER ULTRASOUND OF OVARIES TECHNIQUE: Both transabdominal and transvaginal ultrasound examinations of the pelvis were performed. Transabdominal technique was performed for global imaging of the pelvis including uterus, ovaries, adnexal regions, and pelvic cul-de-sac. It was necessary to proceed with endovaginal exam following the transabdominal exam to visualize the ovaries. Color and duplex Doppler ultrasound was utilized to evaluate blood flow to the ovaries. COMPARISON:  None. FINDINGS: Uterus Measurements: 9 x 5 x 5 cm. No fibroids or other mass visualized. Endometrium Thickness: 10 mm, overestimated due to small volume endometrial cavity fluid. No focal abnormality visualized. Right ovary Measurements: 42 x 35 x 39 mm. The ovary is larger due to a 3.1 cm peripherally hypervascular structure with internal mid level echoes. The margins appear somewhat crenulated. This is likely a hemorrhagic corpus luteum. Left ovary Measurements: 28 x 19 x 17 mm. Normal appearance/no adnexal mass. Pulsed Doppler evaluation of both ovaries demonstrates normal low-resistance arterial and venous waveforms. Other findings Small volume simple appearing pelvic fluid. IMPRESSION: 1. 3.1 cm complex intra-ovarian cyst on the right, likely a hemorrhagic corpus luteum. Recommend follow-up in 6-12 weeks to document expected resolution. 2. Normal bilateral ovarian blood flow. Normal appearance of the uterus and left  ovary. Electronically Signed   By: Marnee SpringJonathon  Watts M.D.   On: 05/14/2017 21:30    Procedures Procedures (including critical care time)  Medications Ordered in ED Medications - No data to display   Initial Impression / Assessment and Plan / ED Course  I have reviewed the triage vital signs and the nursing notes.  Pertinent labs & imaging results that were available during my care of the patient were reviewed by me and considered in my medical decision making (see chart for details).     PT with pelvic pain; She her benign pelvic exam, US is showing cyst. Pt however does indicate that her boyfriend cheated on her 2 months ago, and she would like to be covered for STD and screened for HIV.    Final Clinical Impressions(s) / ED Diagnoses   Final diagnoses:  Pelvic pain  Pelvic pain in female  Cyst of right ovary    New Prescriptions New  Prescriptions   NAPROXEN (NAPROSYN) 500 MG TABLET    Take 1 tablet (500 mg total) by mouth 2 (two) times daily.   I personally performed the services described in this documentation, which was scribed in my presence. The recorded information has been reviewed and is accurate.     Derwood Kaplan, MD 05/14/17 [phone]

## 2017-05-14 NOTE — ED Triage Notes (Signed)
Chills and body aches since Wednesday, abd pain today. States it feels like cramps.

## 2017-05-14 NOTE — ED Notes (Signed)
Pt not in room, pt in US. CBC needs to be redrawn.

## 2017-05-16 LAB — GC/CHLAMYDIA PROBE AMP (~~LOC~~) NOT AT ARMC
Chlamydia: POSITIVE — AB
Neisseria Gonorrhea: NEGATIVE

## 2017-05-19 ENCOUNTER — Telehealth: Payer: Self-pay | Admitting: Medical

## 2017-05-19 DIAGNOSIS — A749 Chlamydial infection, unspecified: Secondary | ICD-10-CM

## 2017-05-19 MED ORDER — AZITHROMYCIN 250 MG PO TABS: 1000.0000 mg | ORAL_TABLET | Freq: Once | ORAL | 0 refills | Status: AC

## 2017-05-19 NOTE — Telephone Encounter (Addendum)
Cecille Amsterdamhanvekeyah Klindt tested positive for  Chlamydia. Patient was called by RN and allergies and pharmacy confirmed. Rx sent to pharmacy of choice.   Marny LowensteinWenzel, Julie N, PA-C 05/19/2017 1:33 PM       ----- Message from Kathe BectonLori S Berdik, RN sent at 05/19/2017  1:08 PM EDT ----- This patient tested positive for Chlamydia  She is allergic to "PCN".  I have informed the patient of her results and confirmed her pharmacy is correct in her chart. Please send Rx.   Thank you,   Kathe BectonBerdik, Lori S, RN   [facility]

## 2017-11-04 ENCOUNTER — Emergency Department (HOSPITAL_BASED_OUTPATIENT_CLINIC_OR_DEPARTMENT_OTHER)
Admission: EM | Admit: 2017-11-04 | Discharge: 2017-11-04 | Disposition: A | Discharge status: Home / Self Care | Payer: Medicaid Other | Attending: Emergency Medicine | Admitting: Emergency Medicine

## 2017-11-04 ENCOUNTER — Encounter (HOSPITAL_BASED_OUTPATIENT_CLINIC_OR_DEPARTMENT_OTHER): Payer: Self-pay | Admitting: *Deleted

## 2017-11-04 ENCOUNTER — Other Ambulatory Visit: Payer: Self-pay

## 2017-11-04 DIAGNOSIS — B001 Herpesviral vesicular dermatitis: Secondary | ICD-10-CM | POA: Insufficient documentation

## 2017-11-04 DIAGNOSIS — K1379 Other lesions of oral mucosa: Secondary | ICD-10-CM | POA: Diagnosis present

## 2017-11-04 DIAGNOSIS — Z79899 Other long term (current) drug therapy: Secondary | ICD-10-CM | POA: Diagnosis not present

## 2017-11-04 DIAGNOSIS — Z8619 Personal history of other infectious and parasitic diseases: Secondary | ICD-10-CM

## 2017-11-04 MED ORDER — VALACYCLOVIR HCL 1 G PO TABS: 1000.0000 mg | ORAL_TABLET | Freq: Three times a day (TID) | ORAL | 1 refills | Status: AC

## 2017-11-04 NOTE — Discharge Instructions (Signed)
Take medications until improving.

## 2017-11-04 NOTE — ED Provider Notes (Signed)
[facility] EMERGENCY DEPARTMENT Provider Note   CSN: [id number] Arrival date & time: 11/04/17  1102     History   Chief Complaint Chief Complaint  Patient presents with  . Mouth Lesions    HPI Carrie Mcdonald is a 32 y.o. female. CC: cold sores  HPI:  Multiple cold sores to upper lip. Prior cold sores. No pharynx lesions. Hurts to open mouth. No difficulty swallowing.   History reviewed. No pertinent past medical history.  Patient Active Problem List   Diagnosis Date Noted  . LEG PAIN, RIGHT 10/28/2010    Past Surgical History:  Procedure Laterality Date  . TUBAL LIGATION      OB History    Gravida Para Term Preterm AB Living   2 1           SAB TAB Ectopic Multiple Live Births                   Home Medications    Prior to Admission medications   Medication Sig Start Date End Date Taking? Authorizing Provider  naproxen (NAPROSYN) 500 MG tablet Take 1 tablet (500 mg total) by mouth 2 (two) times daily. 05/14/17   Derwood Kaplan, MD  ondansetron (ZOFRAN) 4 MG tablet Take 1 tablet (4 mg total) by mouth every 6 (six) hours. 07/09/14   MabeLatanya Maudlin, MD  prenatal vitamin w/FE, FA (PRENATAL 1 + 1) 27-1 MG TABS Take 1 tablet by mouth daily.      [provider]  valACYclovir (VALTREX) 1000 MG tablet Take 1 tablet (1,000 mg total) by mouth 3 (three) times daily for 7 days. 11/04/17 11/11/17  Rolland Porter, MD    Family History No family history on file.  Social History Social History   Tobacco Use  . Smoking status: Never Smoker  . Smokeless tobacco: Never Used  Substance Use Topics  . Alcohol use: Yes    Comment: social  . Drug use: No     Allergies   Penicillins   Review of Systems Review of Systems  Constitutional: Negative for appetite change, chills, diaphoresis, fatigue and fever.  HENT: Negative for mouth sores, sore throat and trouble swallowing.        Lip sores  Eyes: Negative for visual disturbance.  Respiratory:  Negative for cough, chest tightness, shortness of breath and wheezing.   Cardiovascular: Negative for chest pain.  Gastrointestinal: Negative for abdominal distention, abdominal pain, diarrhea, nausea and vomiting.  Endocrine: Negative for polydipsia, polyphagia and polyuria.  Genitourinary: Negative for dysuria, frequency and hematuria.  Musculoskeletal: Negative for gait problem.  Skin: Negative for color change, pallor and rash.  Neurological: Negative for dizziness, syncope, light-headedness and headaches.  Hematological: Does not bruise/bleed easily.  Psychiatric/Behavioral: Negative for behavioral problems and confusion.     Physical Exam Updated Vital Signs BP (!) 148/99 (BP Location: Left Arm)   Pulse 86   Temp 98.1 F (36.7 C) (Oral)   Resp 16   Ht 5\' 1"  (1.549 m)   Wt 58.1 kg (128 lb)   LMP 11/02/2017   SpO2 98%   BMI 24.19 kg/m   Physical Exam  Constitutional: She is oriented to person, place, and time. She appears well-developed and well-nourished. No distress.  HENT:  Head: Normocephalic.  Multiple lip ulcerations. NOrmal pharynx  Eyes: Conjunctivae are normal. Pupils are equal, round, and reactive to light. No scleral icterus.  Neck: Normal range of motion. Neck supple. No thyromegaly present.  Cardiovascular: Normal rate and  regular rhythm. Exam reveals no gallop and no friction rub.  No murmur heard. Pulmonary/Chest: Effort normal and breath sounds normal. No respiratory distress. She has no wheezes. She has no rales.  Abdominal: Soft. Bowel sounds are normal. She exhibits no distension. There is no tenderness. There is no rebound.  Musculoskeletal: Normal range of motion.  Neurological: She is alert and oriented to person, place, and time.  Skin: Skin is warm and dry. No rash noted.  Psychiatric: She has a normal mood and affect. Her behavior is normal.     ED Treatments / Results  Labs (all labs ordered are listed, but only abnormal results are  displayed) Labs Reviewed - No data to display  EKG  EKG Interpretation None       Radiology No results found.  Procedures Procedures (including critical care time)  Medications Ordered in ED Medications - No data to display   Initial Impression / Assessment and Plan / ED Course  I have reviewed the triage vital signs and the nursing notes.  Pertinent labs & imaging results that were available during my care of the patient were reviewed by me and considered in my medical decision making (see chart for details).  Valtrex Rx.. Dc home.   Final Clinical Impressions(s) / ED Diagnoses   Final diagnoses:  Cold sore  Hx of herpes labialis    ED Discharge Orders        Ordered    valACYclovir (VALTREX) 1000 MG tablet  3 times daily     11/04/17 1201       Rolland PorterJames, Jeric Slagel, MD 11/04/17 1207

## 2017-11-04 NOTE — ED Triage Notes (Signed)
Multiple fever blisters to her upper and lower lips since yesterday. Swelling.

## 2018-07-24 ENCOUNTER — Emergency Department (HOSPITAL_BASED_OUTPATIENT_CLINIC_OR_DEPARTMENT_OTHER)
Admission: EM | Admit: 2018-07-24 | Discharge: 2018-07-24 | Disposition: A | Discharge status: Home / Self Care | Payer: 59 | Attending: Emergency Medicine | Admitting: Emergency Medicine

## 2018-07-24 ENCOUNTER — Other Ambulatory Visit: Payer: Self-pay

## 2018-07-24 ENCOUNTER — Emergency Department (HOSPITAL_BASED_OUTPATIENT_CLINIC_OR_DEPARTMENT_OTHER): Payer: 59

## 2018-07-24 ENCOUNTER — Encounter (HOSPITAL_BASED_OUTPATIENT_CLINIC_OR_DEPARTMENT_OTHER): Payer: Self-pay | Admitting: Emergency Medicine

## 2018-07-24 DIAGNOSIS — R102 Pelvic and perineal pain: Secondary | ICD-10-CM

## 2018-07-24 DIAGNOSIS — R110 Nausea: Secondary | ICD-10-CM | POA: Diagnosis not present

## 2018-07-24 LAB — URINALYSIS, ROUTINE W REFLEX MICROSCOPIC
BILIRUBIN URINE: NEGATIVE
GLUCOSE, UA: NEGATIVE mg/dL
Hgb urine dipstick: NEGATIVE
Ketones, ur: NEGATIVE mg/dL
Nitrite: NEGATIVE
PH: 6 (ref 5.0–8.0)
Protein, ur: NEGATIVE mg/dL
SPECIFIC GRAVITY, URINE: 1.025 (ref 1.005–1.030)

## 2018-07-24 LAB — WET PREP, GENITAL
SPERM: NONE SEEN
Trich, Wet Prep: NONE SEEN
Yeast Wet Prep HPF POC: NONE SEEN

## 2018-07-24 LAB — COMPREHENSIVE METABOLIC PANEL
ALK PHOS: 58 U/L (ref 38–126)
ALT: 12 U/L (ref 0–44)
AST: 20 U/L (ref 15–41)
Albumin: 3.6 g/dL (ref 3.5–5.0)
Anion gap: 8 (ref 5–15)
BUN: 15 mg/dL (ref 6–20)
CALCIUM: 8.9 mg/dL (ref 8.9–10.3)
CHLORIDE: 103 mmol/L (ref 98–111)
CO2: 27 mmol/L (ref 22–32)
CREATININE: 0.81 mg/dL (ref 0.44–1.00)
GFR calc non Af Amer: >60 mL/min (ref >60)
GLUCOSE: 83 mg/dL (ref 70–99)
Potassium: 3.5 mmol/L (ref 3.5–5.1)
Sodium: 138 mmol/L (ref 135–145)
Total Bilirubin: 0.3 mg/dL (ref 0.3–1.2)
Total Protein: 7.7 g/dL (ref 6.5–8.1)

## 2018-07-24 LAB — PREGNANCY, URINE: PREG TEST UR: NEGATIVE

## 2018-07-24 LAB — URINALYSIS, MICROSCOPIC (REFLEX): WBC, UA: >50 WBC/hpf (ref 0–5)

## 2018-07-24 LAB — CBC WITH DIFFERENTIAL/PLATELET
BASOS ABS: 0 10*3/uL (ref 0.0–0.1)
Basophils Relative: 0 %
Eosinophils Absolute: 0.1 10*3/uL (ref 0.0–0.7)
Eosinophils Relative: 2 %
HCT: 31.9 % — ABNORMAL LOW (ref 36.0–46.0)
HEMOGLOBIN: 10 g/dL — AB (ref 12.0–15.0)
LYMPHS ABS: 1.3 10*3/uL (ref 0.7–4.0)
LYMPHS PCT: 45 %
MCH: 25.3 pg — AB (ref 26.0–34.0)
MCHC: 31.3 g/dL (ref 30.0–36.0)
MCV: 80.8 fL (ref 78.0–100.0)
Monocytes Absolute: 0.3 10*3/uL (ref 0.1–1.0)
Monocytes Relative: 12 %
NEUTROS PCT: 41 %
Neutro Abs: 1.2 10*3/uL — ABNORMAL LOW (ref 1.7–7.7)
Platelets: 308 10*3/uL (ref 150–400)
RBC: 3.95 MIL/uL (ref 3.87–5.11)
RDW: 14.2 % (ref 11.5–15.5)
WBC: 2.9 10*3/uL — AB (ref 4.0–10.5)

## 2018-07-24 LAB — LIPASE, BLOOD: Lipase: 24 U/L (ref 11–51)

## 2018-07-24 MED ORDER — CEFTRIAXONE SODIUM 250 MG IJ SOLR
250.0000 mg | Freq: Once | INTRAMUSCULAR | Status: AC
  Administered 2018-07-24: 250 mg via INTRAMUSCULAR
  Filled 2018-07-24: qty 250

## 2018-07-24 MED ORDER — ONDANSETRON 4 MG PO TBDP: 4.0000 mg | ORAL_TABLET | Freq: Three times a day (TID) | ORAL | 0 refills | Status: AC | PRN

## 2018-07-24 MED ORDER — METRONIDAZOLE 500 MG PO TABS: 500.0000 mg | ORAL_TABLET | Freq: Two times a day (BID) | ORAL | 0 refills | Status: AC

## 2018-07-24 MED ORDER — DOXYCYCLINE HYCLATE 100 MG PO CAPS: 100.0000 mg | ORAL_CAPSULE | Freq: Two times a day (BID) | ORAL | 0 refills | Status: AC

## 2018-07-24 MED FILL — ONDANSETRON ODT 4 MG TABLET: 4 | 2 days supply | Qty: 8 | Fill #0

## 2018-07-24 MED FILL — metroNIDAZOLE 500 MG TABS: 500 | 7 days supply | Qty: 14 | Fill #0

## 2018-07-24 NOTE — ED Provider Notes (Signed)
[facility] EMERGENCY DEPARTMENT Provider Note   CSN: [id number] Arrival date & time: 07/24/18  4098     History   Chief Complaint Chief Complaint  Patient presents with  . Abdominal Pain    HPI Carrie Mcdonald is a 32 y.o. female.  HPI 32 year old female with no pertinent past medical history presents to the emergency department today for evaluation of abdominal pain, pelvic pain and nausea.  Patient states that for the past 3 days she been having a "pinching sensation" in her lower abdomen that radiates to her pelvis.  She also reports some stomach discomfort with associated nausea.  Denies any change in her bowel habits, including diarrhea, melena or hematochezia.  Patient denies any urinary symptoms.  Denies any vaginal bleeding or discharge.  She states that she is sexually active with one female partner but does use protection has no concern for STD.  Patient denies any associated fevers or chills.  No known sick contacts.  No history of same pain.  Patient denies any abdominal surgeries.  She has not taken anything for her symptoms prior to arrival.  Her's pain is intermittent.  Nothing makes better or worse.  Pt denies any fever, chill, ha, vision changes, lightheadedness, dizziness, congestion, neck pain, cp, sob, cough, urinary symptoms, change in bowel habits, melena, hematochezia, lower extremity paresthesias.  History reviewed. No pertinent past medical history.  Patient Active Problem List   Diagnosis Date Noted  . LEG PAIN, RIGHT 10/28/2010    Past Surgical History:  Procedure Laterality Date  . TUBAL LIGATION       OB History    Gravida  2   Para  1   Term      Preterm      AB      Living        SAB      TAB      Ectopic      Multiple      Live Births               Home Medications    Prior to Admission medications   Medication Sig Start Date End Date Taking? Authorizing Provider  naproxen (NAPROSYN) 500 MG tablet Take  1 tablet (500 mg total) by mouth 2 (two) times daily. 05/14/17   Derwood Kaplan, MD  ondansetron (ZOFRAN) 4 MG tablet Take 1 tablet (4 mg total) by mouth every 6 (six) hours. 07/09/14   MabeLatanya Maudlin, MD  prenatal vitamin w/FE, FA (PRENATAL 1 + 1) 27-1 MG TABS Take 1 tablet by mouth daily.      [provider]    Family History No family history on file.  Social History Social History   Tobacco Use  . Smoking status: Never Smoker  . Smokeless tobacco: Never Used  Substance Use Topics  . Alcohol use: Yes    Comment: social  . Drug use: No     Allergies   Penicillins   Review of Systems Review of Systems  All other systems reviewed and are negative.    Physical Exam Updated Vital Signs BP (!) 143/105 (BP Location: Right Arm)   Pulse 75   Temp 97.8 F (36.6 C) (Oral)   Resp 16   Ht 5\' 1"  (1.549 m)   Wt 58.1 kg   LMP 07/13/2018   SpO2 100%   BMI 24.19 kg/m   Physical Exam  Constitutional: She is oriented to person, place, and time. She appears well-developed and well-nourished.  Non-toxic appearance. No distress.  HENT:  Head: Normocephalic and atraumatic.  Nose: Nose normal.  Mouth/Throat: Oropharynx is clear and moist.  Eyes: Pupils are equal, round, and reactive to light. Conjunctivae are normal. Right eye exhibits no discharge. Left eye exhibits no discharge.  Neck: Normal range of motion. Neck supple.  Cardiovascular: Normal rate, regular rhythm, normal heart sounds and intact distal pulses.  Pulmonary/Chest: Effort normal and breath sounds normal. No respiratory distress. She exhibits no tenderness.  Abdominal: Soft. Normal appearance and bowel sounds are normal. There is tenderness in the suprapubic area. There is no rigidity, no rebound, no guarding, no CVA tenderness, no tenderness at McBurney's point and negative Murphy's sign.  Genitourinary:  Genitourinary Comments: Chaperone present for exam. No external lesions, swelling, erythema, or rash  of the labia. No erythema, , bleeding, or lesions noted in the vaginal vault. Discharge noted in the vaginal vault.  CMT tenderness. No bleeding or friability. Right sided adnexal tenderness. No mass or fullness bilaterally. No inguinal adenopathy or hernia.    Musculoskeletal: Normal range of motion. She exhibits no tenderness.  Lymphadenopathy:    She has no cervical adenopathy.  Neurological: She is alert and oriented to person, place, and time.  Skin: Skin is warm and dry. Capillary refill takes less than 2 seconds.  Psychiatric: Her behavior is normal. Judgment and thought content normal.  Nursing note and vitals reviewed.    ED Treatments / Results  Labs (all labs ordered are listed, but only abnormal results are displayed) Labs Reviewed  WET PREP, GENITAL - Abnormal; Notable for the following components:      Result Value   Clue Cells Wet Prep HPF POC PRESENT (*)    WBC, Wet Prep HPF POC MODERATE (*)    All other components within normal limits  CBC WITH DIFFERENTIAL/PLATELET - Abnormal; Notable for the following components:   WBC 2.9 (*)    Hemoglobin 10.0 (*)    HCT 31.9 (*)    MCH 25.3 (*)    Neutro Abs 1.2 (*)    All other components within normal limits  URINALYSIS, ROUTINE W REFLEX MICROSCOPIC - Abnormal; Notable for the following components:   APPearance HAZY (*)    Leukocytes, UA MODERATE (*)    All other components within normal limits  URINALYSIS, MICROSCOPIC (REFLEX) - Abnormal; Notable for the following components:   Bacteria, UA MANY (*)    All other components within normal limits  URINE CULTURE  COMPREHENSIVE METABOLIC PANEL  LIPASE, BLOOD  PREGNANCY, URINE  GC/CHLAMYDIA PROBE AMP ([place]) NOT AT [facility]    EKG None  Radiology US Transvaginal Non-ob  Result Date: 07/24/2018 CLINICAL DATA:  Patient with mid abdominal and pelvic pain. EXAM: TRANSABDOMINAL AND TRANSVAGINAL ULTRASOUND OF PELVIS DOPPLER ULTRASOUND OF OVARIES TECHNIQUE: Both  transabdominal and transvaginal ultrasound examinations of the pelvis were performed. Transabdominal technique was performed for global imaging of the pelvis including uterus, ovaries, adnexal regions, and pelvic cul-de-sac. It was necessary to proceed with endovaginal exam following the transabdominal exam to visualize the adnexal structures. Color and duplex Doppler ultrasound was utilized to evaluate blood flow to the ovaries. COMPARISON:  Pelvic ultrasound 05/14/2017 FINDINGS: Uterus Measurements: 9.6 x 4.5 x 5.1 cm. No fibroids or other mass visualized. Endometrium Thickness: 9.5 mm.  No focal abnormality visualized. Right ovary Measurements: 3.2 x 1.8 x 4.0 cm. Normal appearance/no adnexal mass. Probable corpus luteum right ovary. Left ovary Measurements: 3.2 x 1.7 x 2.9 cm. Normal appearance/no adnexal mass. Pulsed  Doppler evaluation of both ovaries demonstrates normal low-resistance arterial and venous waveforms. Other findings Trace pelvic fluid. IMPRESSION: No acute process within the pelvis. Electronically Signed   By: Annia Belt M.D.   On: 07/24/2018 13:04   US Pelvis Complete  Result Date: 07/24/2018 CLINICAL DATA:  Patient with mid abdominal and pelvic pain. EXAM: TRANSABDOMINAL AND TRANSVAGINAL ULTRASOUND OF PELVIS DOPPLER ULTRASOUND OF OVARIES TECHNIQUE: Both transabdominal and transvaginal ultrasound examinations of the pelvis were performed. Transabdominal technique was performed for global imaging of the pelvis including uterus, ovaries, adnexal regions, and pelvic cul-de-sac. It was necessary to proceed with endovaginal exam following the transabdominal exam to visualize the adnexal structures. Color and duplex Doppler ultrasound was utilized to evaluate blood flow to the ovaries. COMPARISON:  Pelvic ultrasound 05/14/2017 FINDINGS: Uterus Measurements: 9.6 x 4.5 x 5.1 cm. No fibroids or other mass visualized. Endometrium Thickness: 9.5 mm.  No focal abnormality visualized. Right ovary  Measurements: 3.2 x 1.8 x 4.0 cm. Normal appearance/no adnexal mass. Probable corpus luteum right ovary. Left ovary Measurements: 3.2 x 1.7 x 2.9 cm. Normal appearance/no adnexal mass. Pulsed Doppler evaluation of both ovaries demonstrates normal low-resistance arterial and venous waveforms. Other findings Trace pelvic fluid. IMPRESSION: No acute process within the pelvis. Electronically Signed   By: Annia Belt M.D.   On: 07/24/2018 13:04   [place] Art/ven Flow Abd Pelv Doppler  Result Date: 07/24/2018 CLINICAL DATA:  Patient with mid abdominal and pelvic pain. EXAM: TRANSABDOMINAL AND TRANSVAGINAL ULTRASOUND OF PELVIS DOPPLER ULTRASOUND OF OVARIES TECHNIQUE: Both transabdominal and transvaginal ultrasound examinations of the pelvis were performed. Transabdominal technique was performed for global imaging of the pelvis including uterus, ovaries, adnexal regions, and pelvic cul-de-sac. It was necessary to proceed with endovaginal exam following the transabdominal exam to visualize the adnexal structures. Color and duplex Doppler ultrasound was utilized to evaluate blood flow to the ovaries. COMPARISON:  Pelvic ultrasound 05/14/2017 FINDINGS: Uterus Measurements: 9.6 x 4.5 x 5.1 cm. No fibroids or other mass visualized. Endometrium Thickness: 9.5 mm.  No focal abnormality visualized. Right ovary Measurements: 3.2 x 1.8 x 4.0 cm. Normal appearance/no adnexal mass. Probable corpus luteum right ovary. Left ovary Measurements: 3.2 x 1.7 x 2.9 cm. Normal appearance/no adnexal mass. Pulsed Doppler evaluation of both ovaries demonstrates normal low-resistance arterial and venous waveforms. Other findings Trace pelvic fluid. IMPRESSION: No acute process within the pelvis. Electronically Signed   By: Annia Belt M.D.   On: 07/24/2018 13:04    Procedures Procedures (including critical care time)  Medications Ordered in ED Medications  cefTRIAXone (ROCEPHIN) injection 250 mg (250 mg Intramuscular Given 07/24/18 1358)      Initial Impression / Assessment and Plan / ED Course  I have reviewed the triage vital signs and the nursing notes.  Pertinent labs & imaging results that were available during my care of the patient were reviewed by me and considered in my medical decision making (see chart for details).     Pt presents to the ED for pelvic pain. Reports nausea but denies any other associated symptoms. VS reassuring. On exam pt has some mild suprapubic abd tenderness. No other focal tenderness. Labs reassuring. Mild leukopenia of 2.9. Hgb at pt baseline. Pt has no electrolyte abnormality. Normal lipase. UA with moderate luekocytesterase with many bacteria not nitrite negative. Pt denies any urinary symptoms. Culture pending. Urine preg negative. Pelvic exam reviews moderate discharge with some cmt. Wet prep shows clue cells, and moderate wbc. Pelvic exam reassuring. Given that pt has  moderate discharge and cmt will treat for PID with rocephin and doxy. WIll give flagyl for bv. Given that pt has no uti symptoms will await for culture and treat based on culture results. Pt gc/chy pending. Dicussed with pharmacy. Pt is agreeable with the above plan. She remains hemodynamically stable. Presentation not concerning for appendicitis, diverticulitis, bowel obstruction, cholecystitis, pancreatitis, ectopic pregnancy, pyelo, kidney stone.  Pt is hemodynamically stable, in NAD, & able to ambulate in the ED. Evaluation does not show pathology that would require ongoing emergent intervention or inpatient treatment. I explained the diagnosis to the patient. Pain has been managed & has no complaints prior to dc. Pt is comfortable with above plan and is stable for discharge at this time. All questions were answered prior to disposition. Strict return precautions for f/u to the ED were discussed. Encouraged follow up with PCP.   Final Clinical Impressions(s) / ED Diagnoses   Final diagnoses:  Pelvic pain    ED Discharge  Orders         Ordered    doxycycline (VIBRAMYCIN) 100 MG capsule  2 times daily     07/24/18 1400    metroNIDAZOLE (FLAGYL) 500 MG tablet  2 times daily with meals     07/24/18 1400    ondansetron (ZOFRAN ODT) 4 MG disintegrating tablet  Every 8 hours PRN     07/24/18 1400           Wallace Keller 07/24/18 2208    Sabas Sous, MD 07/25/18 1534

## 2018-07-24 NOTE — ED Notes (Signed)
Pt placed in gown.

## 2018-07-24 NOTE — ED Triage Notes (Signed)
Pt c/o middle abd and pelvic pain since Fri; + nausea, no diarrhea; describes pain as pinching

## 2018-07-24 NOTE — Discharge Instructions (Addendum)
Have discussed your findings with you.  Concern for possible pelvic inflammatory disease.  I will treat with doxycycline and Flagyl.  Your urine culture is pending if this is some it needs to be treated you be notified.  Have given you Zofran for nausea.  May take Motrin and Tylenol for pain keep a warm compress to the pelvic area.  Please follow-up or return the ED if you develop any worsening symptoms including fevers, worsening pain, vaginal discharge, urinary symptoms or any other reason.  Letter

## 2018-07-25 LAB — GC/CHLAMYDIA PROBE AMP (~~LOC~~) NOT AT ARMC
CHLAMYDIA, DNA PROBE: NEGATIVE
NEISSERIA GONORRHEA: NEGATIVE

## 2018-07-25 LAB — URINE CULTURE

## 2018-07-26 ENCOUNTER — Telehealth: Payer: Self-pay | Admitting: *Deleted

## 2018-07-26 NOTE — Progress Notes (Signed)
ED Antimicrobial Stewardship Positive Culture Follow Up   Carrie Mcdonald is an 32 y.o. female who presented to [facility] on 07/24/2018 with a chief complaint of middle abdominal pain and pelvic pain with nausea for 3 days. Denies any urinary symptoms or fevers.  Chief Complaint  Patient presents with  . Abdominal Pain    Recent Results (from the past 720 hour(s))  Urine culture     Status: Abnormal   Collection Time: 07/24/18 10:52 AM  Result Value Ref Range Status   Specimen Description   Final    URINE, CLEAN CATCH Performed at [facility], [address] [place], [place] [address]    Special Requests   Final    NONE Performed at [facility], [address] [place], [place] [address]    Culture (A)  Final    80,000 COLONIES/mL GROUP B STREP(S.AGALACTIAE)ISOLATED TESTING AGAINST S. AGALACTIAE NOT ROUTINELY PERFORMED DUE TO PREDICTABILITY OF AMP/PEN/VAN SUSCEPTIBILITY. Performed at [facility], [address], [place], [place] [address]    Report Status 07/25/2018 FINAL  Final  Wet prep, genital     Status: Abnormal   Collection Time: 07/24/18 12:01 PM  Result Value Ref Range Status   Yeast Wet Prep HPF POC NONE SEEN NONE SEEN Final   Trich, Wet Prep NONE SEEN NONE SEEN Final   Clue Cells Wet Prep HPF POC PRESENT (A) NONE SEEN Final   WBC, Wet Prep HPF POC MODERATE (A) NONE SEEN Final   Sperm NONE SEEN  Final    Comment: Performed at [facility], [address] [place], [place] [address]   Patient discharged with doxycycline and metronidazole for treatment of PID and BV.   No therapy necessary for group B strep in urine culture. Continue current antibiotics for PID and BV.  ED Provider: Harolyn Rutherford, PA-C  Danae Orleans, PharmD 07/26/2018       9:38 AM  Clinical Pharmacist Monday - Friday phone -  [phone] Saturday - Sunday phone - [phone]

## 2018-07-26 NOTE — Telephone Encounter (Signed)
Post ED Visit - Positive Culture Follow-up  Culture report reviewed by antimicrobial stewardship pharmacist:  []  Enzo Bi, Pharm.D. []  Celedonio Miyamoto, Pharm.D., BCPS AQ-ID []  Garvin Fila, Pharm.D., BCPS []  Georgina Pillion, Pharm.D., BCPS []  [place], [place].D., BCPS, AAHIVP []  Estella Husk, Pharm.D., BCPS, AAHIVP []  Lysle Pearl, PharmD, BCPS []  Phillips Climes, PharmD, BCPS []  Agapito Games, PharmD, BCPS []  Verlan Friends, PharmD  Positive urine culture Treated with Doxycycline Hyclate, organism sensitive to the same and no further patient follow-up is required at this time.  Virl Axe [facility] 07/26/2018, 10:49 AM

## 2018-08-12 IMAGING — US US TRANSVAGINAL NON-OB
1 series · 13 of 25 positions shown · non-contrast
Comparison: None.

CLINICAL DATA: Mid pelvic pain and nausea for 3 days

EXAM:
TRANSABDOMINAL AND TRANSVAGINAL ULTRASOUND OF PELVIS
DOPPLER ULTRASOUND OF OVARIES
TECHNIQUE: Both transabdominal and transvaginal ultrasound examinations of the
pelvis were performed. Transabdominal technique was performed for
global imaging of the pelvis including uterus, ovaries, adnexal
regions, and pelvic cul-de-sac.
It was necessary to proceed with endovaginal exam following the
transabdominal exam to visualize the ovaries. Color and duplex
Doppler ultrasound was utilized to evaluate blood flow to the
ovaries.

[Series 1: us transvaginal non-ob · 0.18mm/px · 13 of 70 slices shown]
[im 1/70]
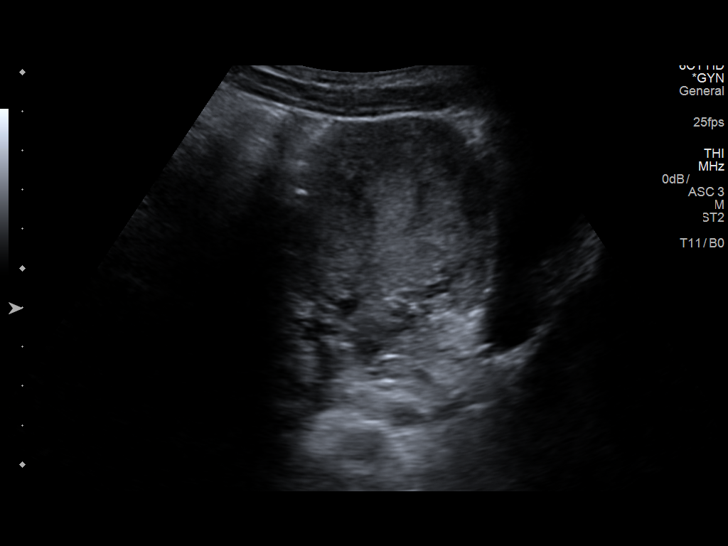
[im 6/70]
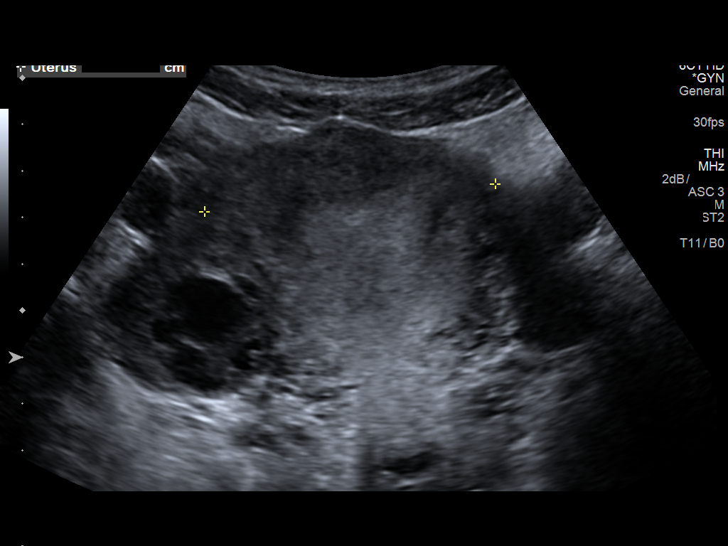
[im 12/70]
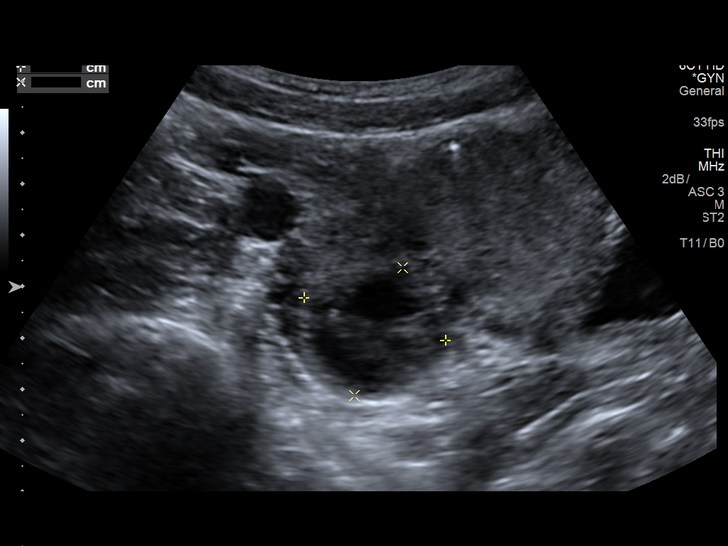
[im 18/70]
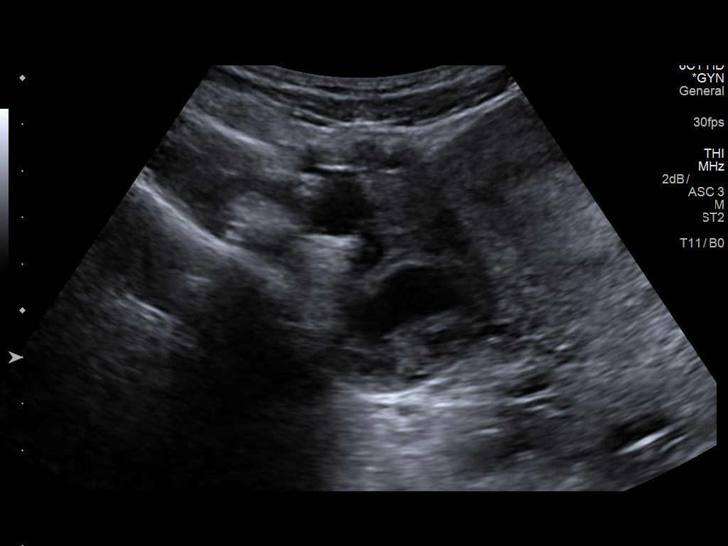
[im 24/70]
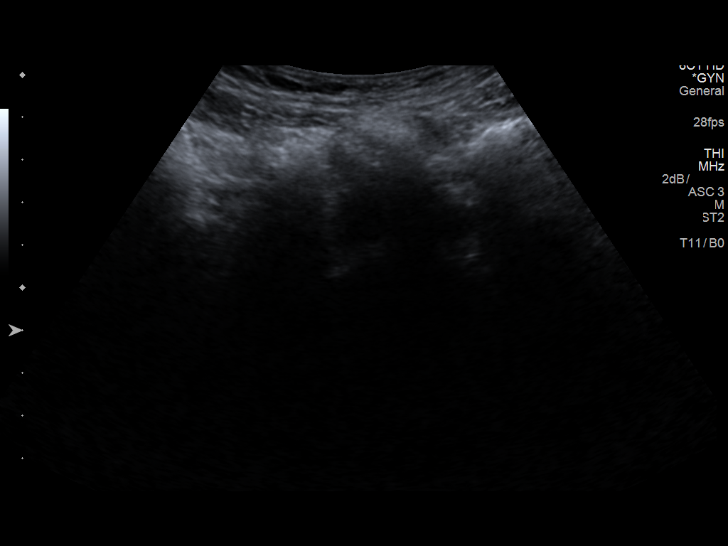
[im 29/70]
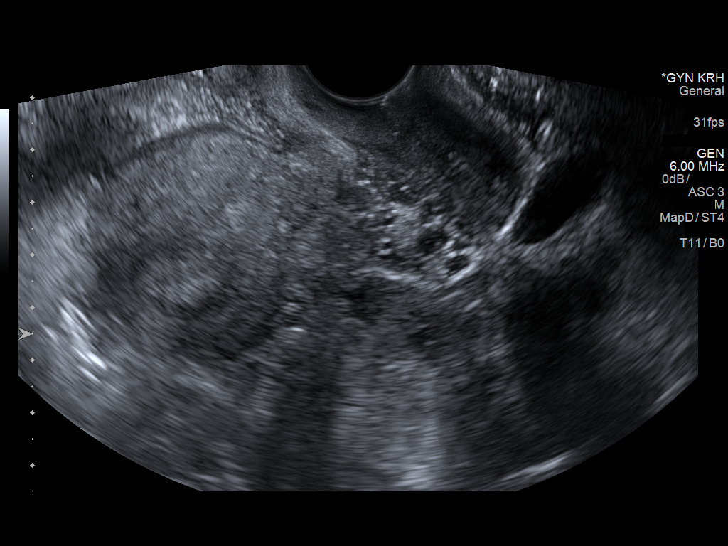
[im 35/70]
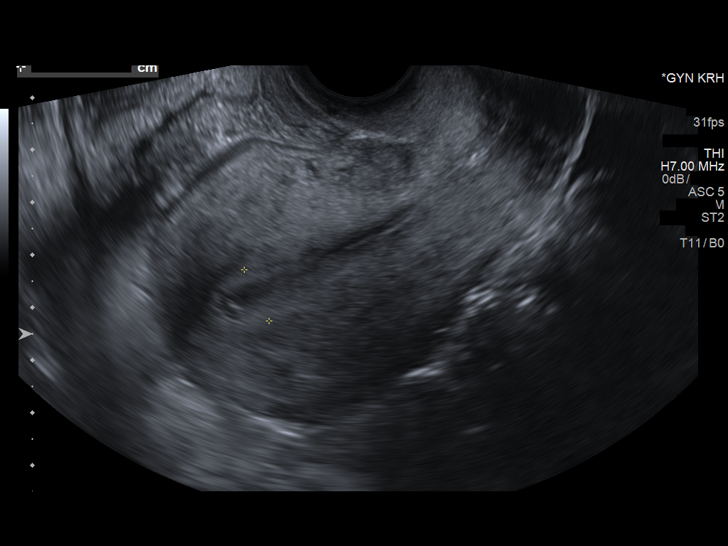
[im 41/70]
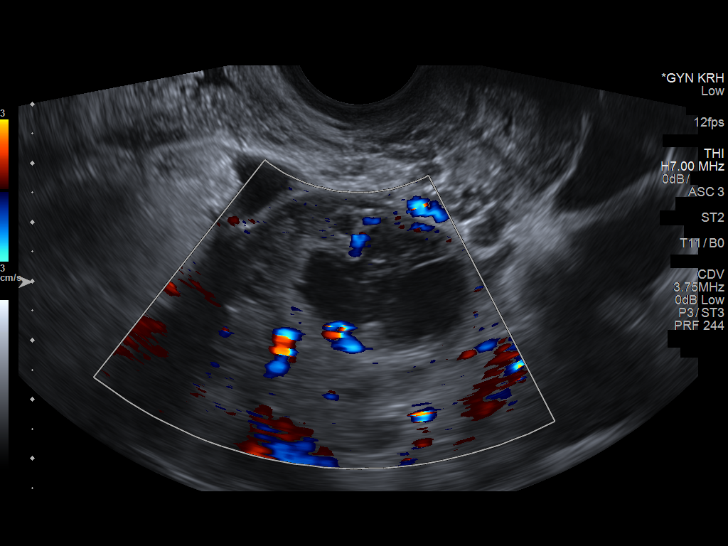
[im 47/70]
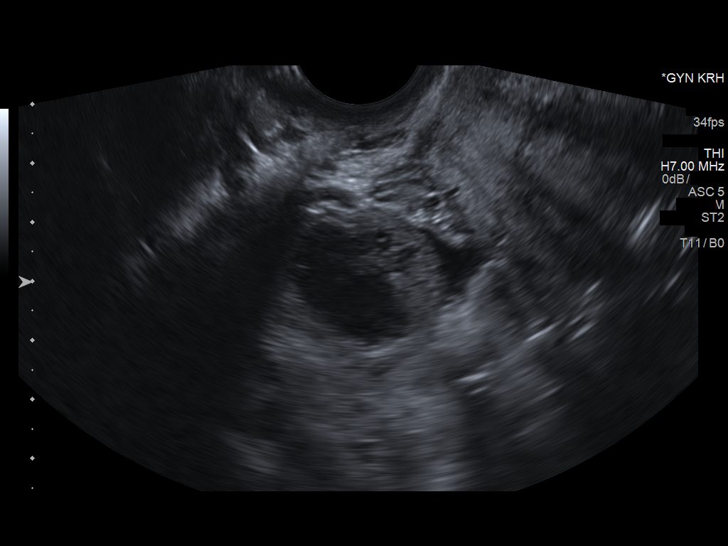
[im 52/70]
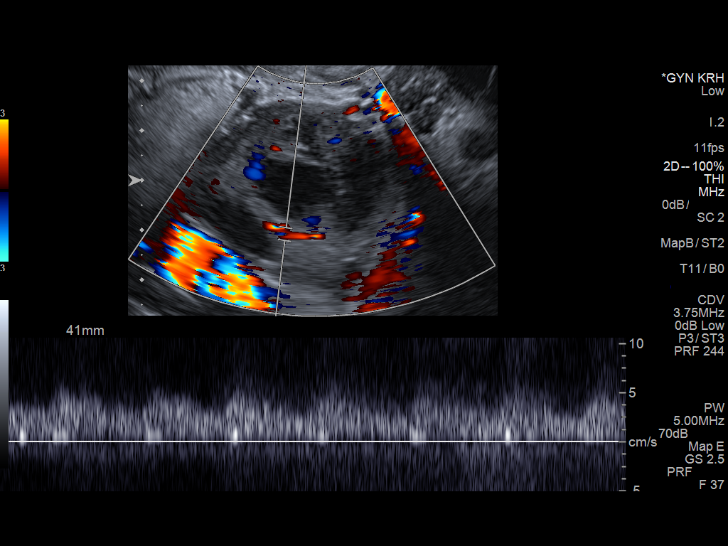
[im 58/70]
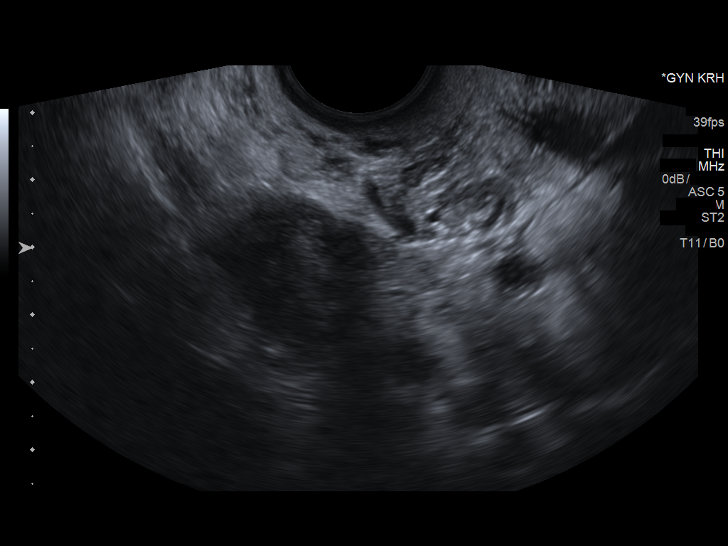
[im 64/70]
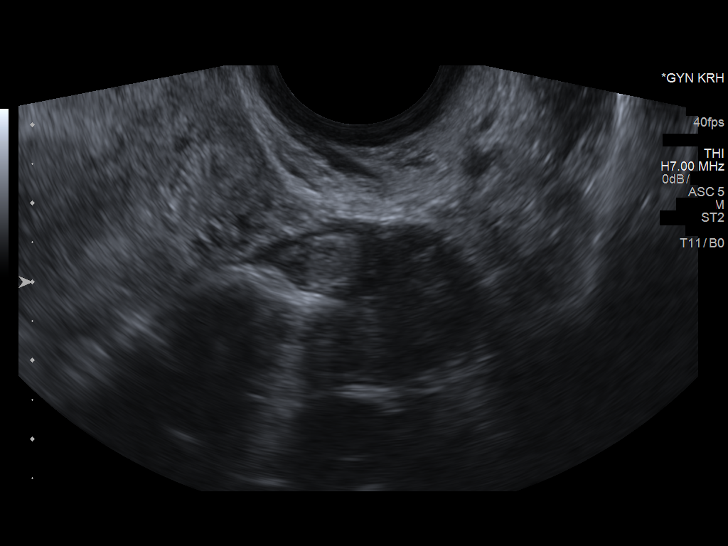
[im 70/70]
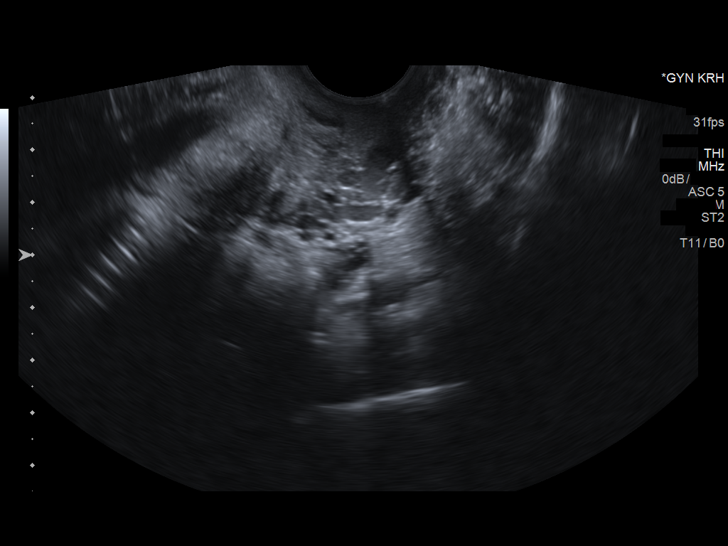

[13 of 25 positions shown; findings below may reference images not displayed]

FINDINGS: Uterus

Measurements: 9 x 5 x 5 cm. No fibroids or other mass visualized.

Endometrium

Thickness: 10 mm, overestimated due to small volume endometrial
cavity fluid. No focal abnormality visualized.

Right ovary

Measurements: 42 x 35 x 39 mm. The ovary is larger due to a 3.1 cm
peripherally hypervascular structure with internal mid level echoes.
The margins appear somewhat crenulated. This is likely a hemorrhagic
corpus luteum.

Left ovary

Measurements: 28 x 19 x 17 mm. Normal appearance/no adnexal mass.

Pulsed Doppler evaluation of both ovaries demonstrates normal
low-resistance arterial and venous waveforms.

Other findings

Small volume simple appearing pelvic fluid.
IMPRESSION: 1. 3.1 cm complex intra-ovarian cyst on the right, likely a
hemorrhagic corpus luteum. Recommend follow-up in 6-12 weeks to
document expected resolution.
2. Normal bilateral ovarian blood flow. Normal appearance of the
uterus and left ovary.

## 2018-08-15 ENCOUNTER — Encounter (HOSPITAL_BASED_OUTPATIENT_CLINIC_OR_DEPARTMENT_OTHER): Payer: Self-pay | Admitting: *Deleted

## 2018-08-15 ENCOUNTER — Emergency Department (HOSPITAL_BASED_OUTPATIENT_CLINIC_OR_DEPARTMENT_OTHER)
Admission: EM | Admit: 2018-08-15 | Discharge: 2018-08-15 | Disposition: A | Discharge status: Home / Self Care | Payer: 59 | Attending: Emergency Medicine | Admitting: Emergency Medicine

## 2018-08-15 ENCOUNTER — Emergency Department (HOSPITAL_BASED_OUTPATIENT_CLINIC_OR_DEPARTMENT_OTHER): Payer: 59

## 2018-08-15 ENCOUNTER — Other Ambulatory Visit: Payer: Self-pay

## 2018-08-15 DIAGNOSIS — D649 Anemia, unspecified: Secondary | ICD-10-CM | POA: Insufficient documentation

## 2018-08-15 DIAGNOSIS — Z3202 Encounter for pregnancy test, result negative: Secondary | ICD-10-CM | POA: Diagnosis not present

## 2018-08-15 DIAGNOSIS — Z79899 Other long term (current) drug therapy: Secondary | ICD-10-CM | POA: Diagnosis not present

## 2018-08-15 DIAGNOSIS — I10 Essential (primary) hypertension: Secondary | ICD-10-CM | POA: Diagnosis not present

## 2018-08-15 DIAGNOSIS — R072 Precordial pain: Secondary | ICD-10-CM | POA: Diagnosis not present

## 2018-08-15 DIAGNOSIS — R0602 Shortness of breath: Secondary | ICD-10-CM | POA: Insufficient documentation

## 2018-08-15 LAB — BASIC METABOLIC PANEL
Anion gap: 8 (ref 5–15)
BUN: 13 mg/dL (ref 6–20)
CO2: 26 mmol/L (ref 22–32)
Calcium: 9 mg/dL (ref 8.9–10.3)
Chloride: 102 mmol/L (ref 98–111)
Creatinine, Ser: 0.74 mg/dL (ref 0.44–1.00)
GFR calc Af Amer: >60 mL/min (ref >60)
GFR calc non Af Amer: >60 mL/min (ref >60)
Glucose, Bld: 97 mg/dL (ref 70–99)
POTASSIUM: 3.7 mmol/L (ref 3.5–5.1)
Sodium: 136 mmol/L (ref 135–145)

## 2018-08-15 LAB — CBC
HEMATOCRIT: 32.4 % — AB (ref 36.0–46.0)
HEMOGLOBIN: 9.8 g/dL — AB (ref 12.0–15.0)
MCH: 25.5 pg — ABNORMAL LOW (ref 26.0–34.0)
MCHC: 30.2 g/dL (ref 30.0–36.0)
MCV: 84.4 fL (ref 80.0–100.0)
Platelets: 367 10*3/uL (ref 150–400)
RBC: 3.84 MIL/uL — AB (ref 3.87–5.11)
RDW: 15 % (ref 11.5–15.5)
WBC: 4.7 10*3/uL (ref 4.0–10.5)
nRBC: 0 % (ref 0.0–0.2)

## 2018-08-15 LAB — TROPONIN I: Troponin I: <0.03 ng/mL (ref <0.03)

## 2018-08-15 LAB — PREGNANCY, URINE: PREG TEST UR: NEGATIVE

## 2018-08-15 NOTE — ED Provider Notes (Signed)
[facility] EMERGENCY DEPARTMENT Provider Note   CSN: [id number] Arrival date & time: 08/15/18  1826     History   Chief Complaint Chief Complaint  Patient presents with  . Chest Pain    HPI Carrie Mcdonald is a 32 y.o. female.  Patient had experienced some substernal chest pain on Sunday afternoon.  Not sure how long it lasted.  Did not experience any on Monday.  Today at around 1230 while eating lunch got some substernal nonradiating chest pain a little bit worse with deep breath.  Some slight shortness of breath.  No leg swelling.  Patient not on birth control pills.  No history of similar findings.  Patient without any cardiac risk factors.  Pain is minimal currently.  Is made worse a little bit by taking a deep breath and a little bit by movement.     History reviewed. No pertinent past medical history.  Patient Active Problem List   Diagnosis Date Noted  . LEG PAIN, RIGHT 10/28/2010    Past Surgical History:  Procedure Laterality Date  . TUBAL LIGATION     Essure     OB History    Gravida  3   Para  3   Term  3   Preterm  0   AB  0   Living  0     SAB  0   TAB  0   Ectopic  0   Multiple  0   Live Births  3            Home Medications    Prior to Admission medications   Medication Sig Start Date End Date Taking? Authorizing Provider  doxycycline (VIBRAMYCIN) 100 MG capsule Take 1 capsule (100 mg total) by mouth 2 (two) times daily. 07/24/18   Rise Mu, PA-C  metroNIDAZOLE (FLAGYL) 500 MG tablet Take 1 tablet (500 mg total) by mouth 2 (two) times daily with a meal. DO NOT CONSUME ALCOHOL WHILE TAKING THIS MEDICATION. 07/24/18   Rise Mu, PA-C  naproxen (NAPROSYN) 500 MG tablet Take 1 tablet (500 mg total) by mouth 2 (two) times daily. 05/14/17   Derwood Kaplan, MD  ondansetron (ZOFRAN ODT) 4 MG disintegrating tablet Take 1 tablet (4 mg total) by mouth every 8 (eight) hours as needed for nausea or  vomiting. 07/24/18   Leaphart, Lynann Beaver, PA-C  ondansetron (ZOFRAN) 4 MG tablet Take 1 tablet (4 mg total) by mouth every 6 (six) hours. 07/09/14   MabeLatanya Maudlin, MD  prenatal vitamin w/FE, FA (PRENATAL 1 + 1) 27-1 MG TABS Take 1 tablet by mouth daily.      [provider]    Family History No family history on file.  Social History Social History   Tobacco Use  . Smoking status: Never Smoker  . Smokeless tobacco: Never Used  Substance Use Topics  . Alcohol use: Yes    Comment: social  . Drug use: No     Allergies   Penicillins   Review of Systems Review of Systems  Constitutional: Negative for fever.  HENT: Negative for congestion.   Eyes: Negative for visual disturbance.  Respiratory: Positive for shortness of breath.   Cardiovascular: Positive for chest pain. Negative for palpitations and leg swelling.  Gastrointestinal: Negative for abdominal pain and nausea.  Genitourinary: Negative for dysuria.  Musculoskeletal: Negative for back pain.  Skin: Negative for rash.  Neurological: Negative for syncope.  Hematological: Does not bruise/bleed easily.  Psychiatric/Behavioral: Negative  for confusion.     Physical Exam Updated Vital Signs BP 120/88   Pulse 79   Temp 98.2 F (36.8 C) (Oral)   Resp 16   Ht 1.549 m (5\' 1" )   Wt 58 kg   LMP 08/14/2018   SpO2 100%   BMI 24.16 kg/m   Physical Exam  Constitutional: She is oriented to person, place, and time. She appears well-developed and well-nourished. No distress.  HENT:  Head: Normocephalic and atraumatic.  Mouth/Throat: Oropharynx is clear and moist.  Eyes: Pupils are equal, round, and reactive to light. Conjunctivae and EOM are normal.  Neck: Neck supple.  Cardiovascular: Normal rate, regular rhythm and normal heart sounds.  Pulmonary/Chest: Effort normal and breath sounds normal. No respiratory distress. She exhibits no tenderness.  Abdominal: Soft. Bowel sounds are normal. She exhibits no  distension. There is no tenderness.  Musculoskeletal: Normal range of motion. She exhibits no edema.  Neurological: She is alert and oriented to person, place, and time. No cranial nerve deficit. She exhibits normal muscle tone. Coordination normal.  Skin: Skin is warm. No rash noted.  Nursing note and vitals reviewed.    ED Treatments / Results  Labs (all labs ordered are listed, but only abnormal results are displayed) Labs Reviewed  CBC - Abnormal; Notable for the following components:      Result Value   RBC 3.84 (*)    Hemoglobin 9.8 (*)    HCT 32.4 (*)    MCH 25.5 (*)    All other components within normal limits  BASIC METABOLIC PANEL  TROPONIN I  PREGNANCY, URINE    EKG EKG Interpretation  Date/Time:  Tuesday August 15 2018 18:33:05 EDT Ventricular Rate:  68 PR Interval:  154 QRS Duration: 68 QT Interval:  396 QTC Calculation: 421 R Axis:   75 Text Interpretation:  Normal sinus rhythm with sinus arrhythmia Normal ECG No previous ECGs available Confirmed by Vanetta Mulders [phone]) on 08/15/2018 10:19:11 PM   Radiology Dg Chest 2 View  Result Date: 08/15/2018 CLINICAL DATA:  Mid chest pain and shortness of breath over the last 2 days. EXAM: CHEST - 2 VIEW COMPARISON:  None. FINDINGS: Heart size is normal. Mediastinal shadows are normal. The lungs are clear. No bronchial thickening. No infiltrate, mass, effusion or collapse. Pulmonary vascularity is normal. No bony abnormality. IMPRESSION: Normal chest Electronically Signed   By: Paulina Fusi M.D.   On: 08/15/2018 20:19    Procedures Procedures (including critical care time)  Medications Ordered in ED Medications - No data to display   Initial Impression / Assessment and Plan / ED Course  I have reviewed the triage vital signs and the nursing notes.  Pertinent labs & imaging results that were available during my care of the patient were reviewed by me and considered in my medical decision making (see chart  for details).     Work-up here chest pain symptoms since 1230 today.  Also had similar symptoms on Sunday but none on Monday.  Made a little bit worse with deep breath.  No leg swelling.  Patient was PERC negative.  D-dimer was not checked.  Patient also had some marginally elevated blood pressures will follow-up with primary care doctor for this.  And also had a little worsening of her anemia but hemoglobin was still in the 9 range.  Patient nontoxic no acute distress.  Patient without any significant cardiac risk factors.  Would recommend treatment with Motrin or Naprosyn and close follow-up with primary  care doctor for recheck of blood pressure and hemoglobin.  Final Clinical Impressions(s) / ED Diagnoses   Final diagnoses:  Precordial pain  Anemia, unspecified type  Essential hypertension    ED Discharge Orders    None       Vanetta Mulders, MD 08/15/18 2305

## 2018-08-15 NOTE — ED Triage Notes (Signed)
Chest pain all day. Tightness in the center of her chest that comes and goes.

## 2018-08-15 NOTE — ED Notes (Signed)
Pt smiling and talkative. No c/o at present.

## 2018-08-15 NOTE — Discharge Instructions (Addendum)
Today's work-up without any significant findings.  Would recommend Motrin or Naprosyn for the pain.  Make an appointment to follow-up with your doctor.  Need follow-up for blood pressure which was marginally high here today also follow-up for your anemia which is a little bit worse.  And also follow-up for the chest pain which appears to be noncardiac in nature.

## 2019-10-22 IMAGING — US US ART/VEN ABD/PELV/SCROTUM DOPPLER LTD
1 series · 13 of 25 positions shown · non-contrast
Comparison: Pelvic ultrasound 05/14/2017

CLINICAL DATA: Patient with mid abdominal and pelvic pain.

EXAM:
TRANSABDOMINAL AND TRANSVAGINAL ULTRASOUND OF PELVIS
DOPPLER ULTRASOUND OF OVARIES
TECHNIQUE: Both transabdominal and transvaginal ultrasound examinations of the
pelvis were performed. Transabdominal technique was performed for
global imaging of the pelvis including uterus, ovaries, adnexal
regions, and pelvic cul-de-sac.
It was necessary to proceed with endovaginal exam following the
transabdominal exam to visualize the adnexal structures. Color and
duplex Doppler ultrasound was utilized to evaluate blood flow to the
ovaries.

[Series 1: us art/ven abd/pelv/scrotum doppler ltd · 0.17mm/px · 13 of 121 slices shown]
[im 1/121]
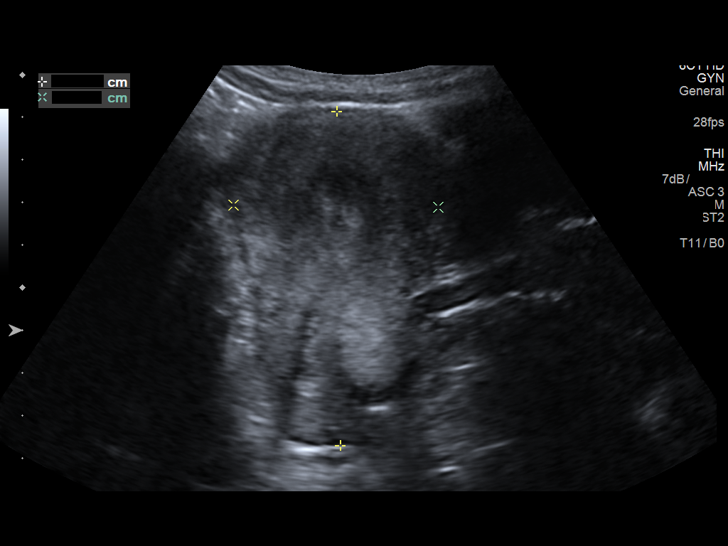
[im 11/121]
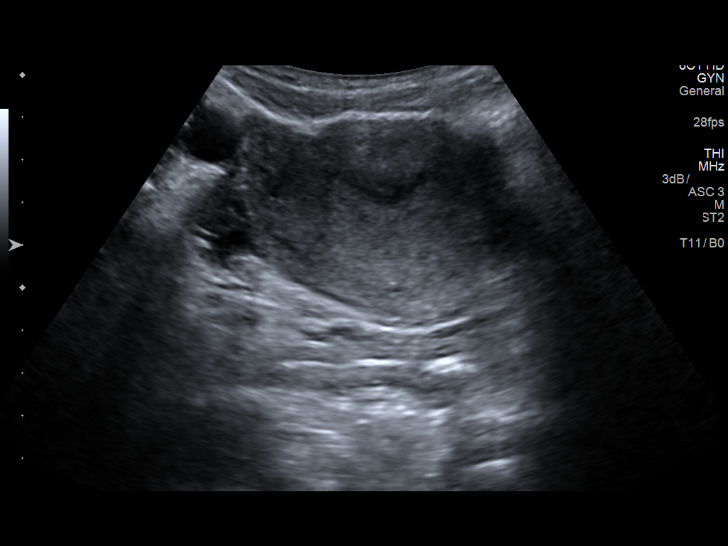
[im 21/121]
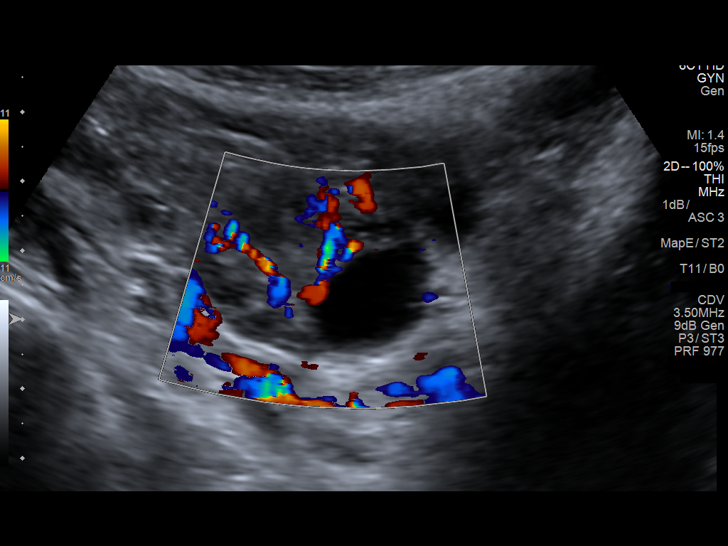
[im 31/121]
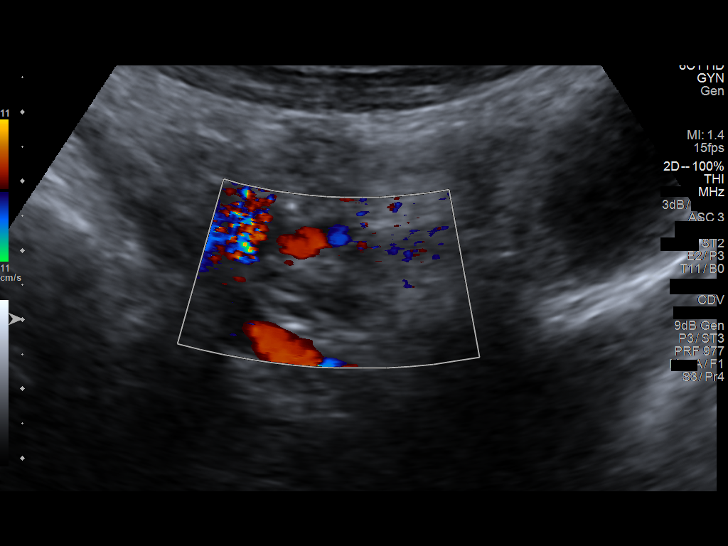
[im 41/121]
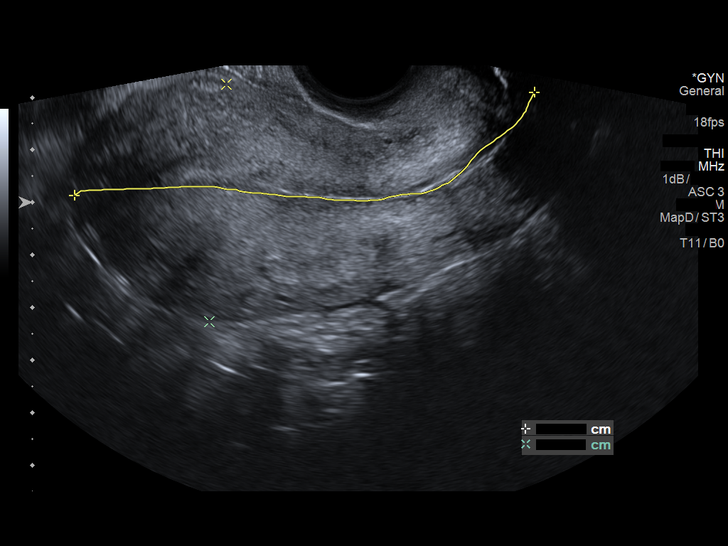
[im 51/121]
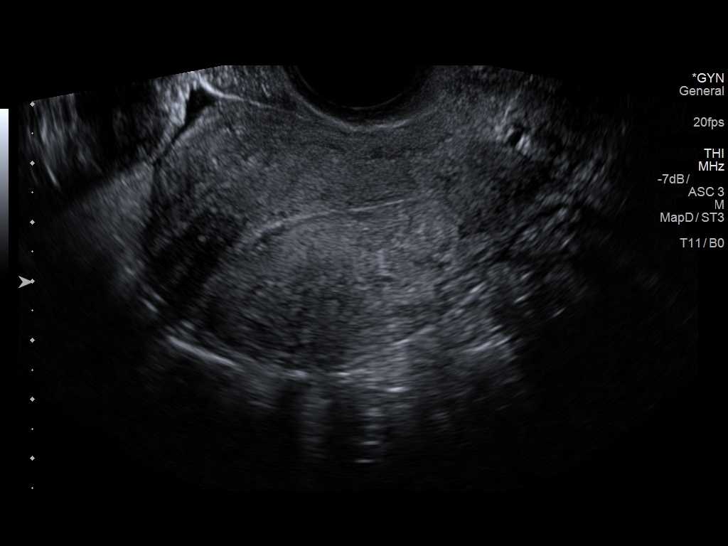
[im 61/121]
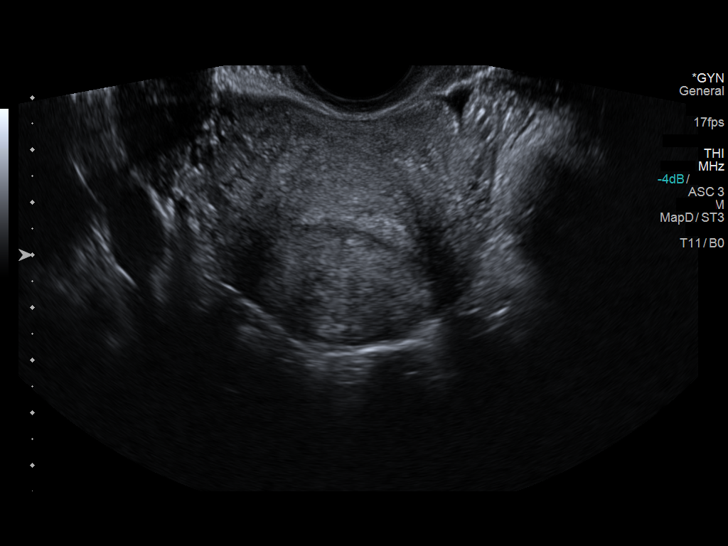
[im 71/121]
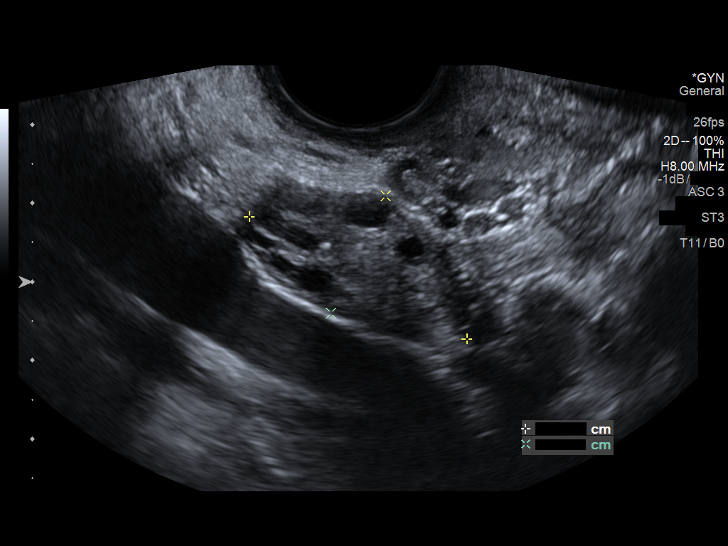
[im 81/121]
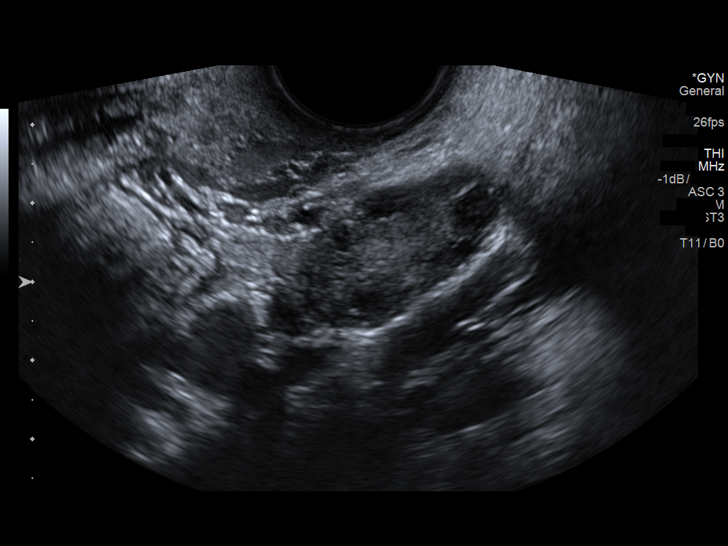
[im 91/121]
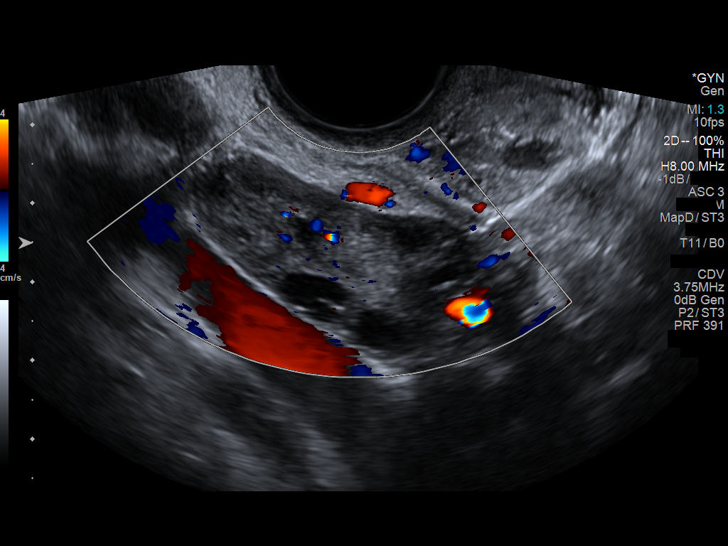
[im 101/121]
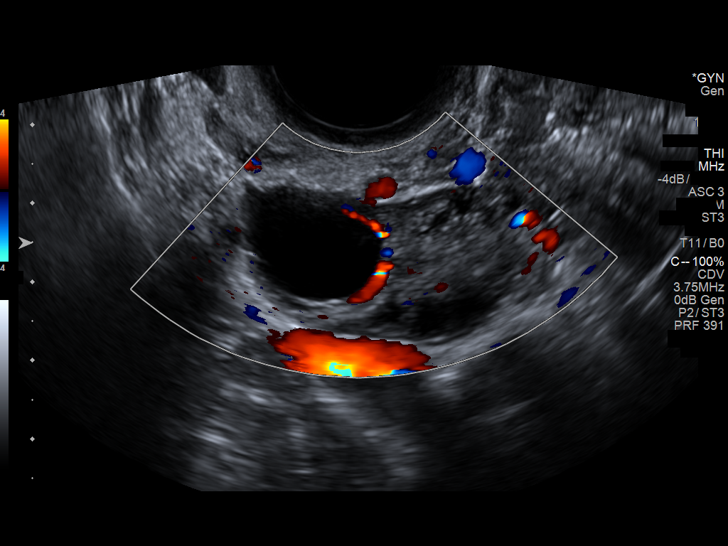
[im 111/121]
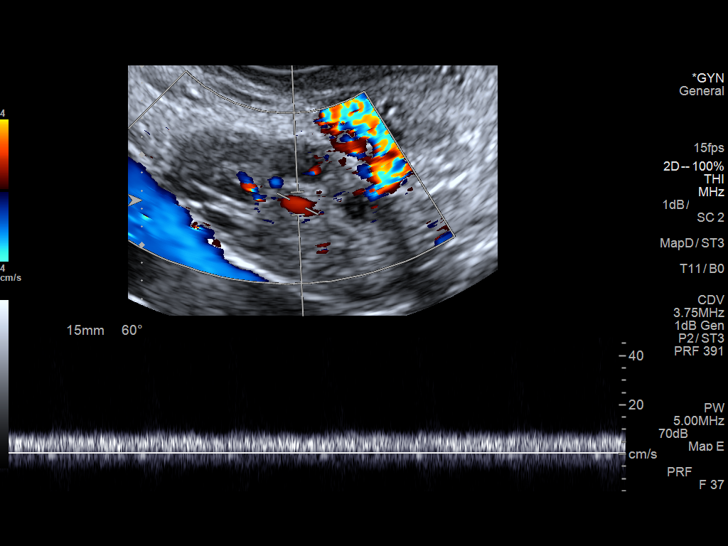
[im 121/121]
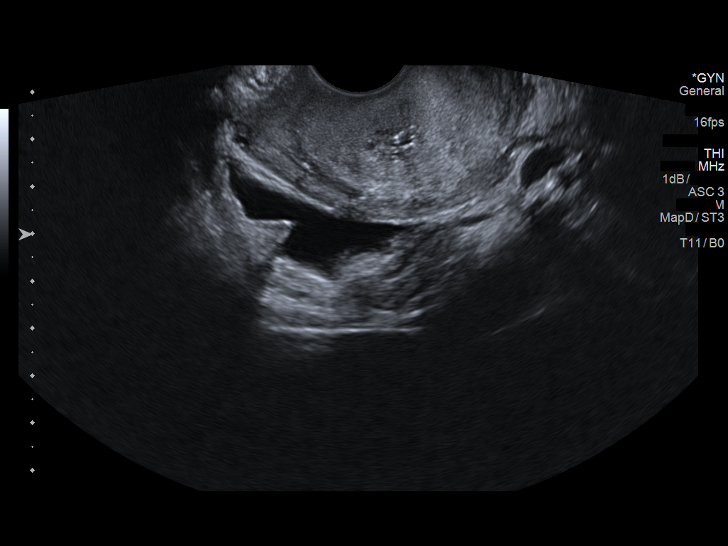

[13 of 25 positions shown; findings below may reference images not displayed]

FINDINGS: Uterus

Measurements: 9.6 x 4.5 x 5.1 cm. No fibroids or other mass
visualized.

Endometrium

Thickness: 9.5 mm.  No focal abnormality visualized.

Right ovary

Measurements: 3.2 x 1.8 x 4.0 cm. Normal appearance/no adnexal mass.
Probable corpus luteum right ovary.

Left ovary

Measurements: 3.2 x 1.7 x 2.9 cm. Normal appearance/no adnexal mass.

Pulsed Doppler evaluation of both ovaries demonstrates normal
low-resistance arterial and venous waveforms.

Other findings

Trace pelvic fluid.
IMPRESSION: No acute process within the pelvis.

## 2021-07-01 ENCOUNTER — Other Ambulatory Visit (HOSPITAL_BASED_OUTPATIENT_CLINIC_OR_DEPARTMENT_OTHER): Payer: Self-pay

## 2021-07-01 ENCOUNTER — Encounter (HOSPITAL_BASED_OUTPATIENT_CLINIC_OR_DEPARTMENT_OTHER): Payer: Self-pay | Admitting: *Deleted

## 2021-07-01 ENCOUNTER — Other Ambulatory Visit: Payer: Self-pay

## 2021-07-01 ENCOUNTER — Emergency Department (HOSPITAL_BASED_OUTPATIENT_CLINIC_OR_DEPARTMENT_OTHER)
Admission: EM | Admit: 2021-07-01 | Discharge: 2021-07-01 | Disposition: A | Discharge status: Home / Self Care | Payer: Medicaid Other | Attending: Emergency Medicine | Admitting: Emergency Medicine

## 2021-07-01 DIAGNOSIS — R000 Tachycardia, unspecified: Secondary | ICD-10-CM | POA: Insufficient documentation

## 2021-07-01 DIAGNOSIS — U071 COVID-19: Secondary | ICD-10-CM | POA: Diagnosis not present

## 2021-07-01 DIAGNOSIS — J069 Acute upper respiratory infection, unspecified: Secondary | ICD-10-CM | POA: Diagnosis not present

## 2021-07-01 DIAGNOSIS — R059 Cough, unspecified: Secondary | ICD-10-CM | POA: Diagnosis present

## 2021-07-01 MED ORDER — BENZONATATE 100 MG PO CAPS
100.0000 mg | ORAL_CAPSULE | Freq: Three times a day (TID) | ORAL | 0 refills | Status: AC
  Filled 2021-07-01: qty 21, 7d supply, fill #0

## 2021-07-01 MED ORDER — ACETAMINOPHEN 500 MG PO TABS
1000.0000 mg | ORAL_TABLET | Freq: Once | ORAL | Status: AC
  Administered 2021-07-01: 1000 mg via ORAL
  Filled 2021-07-01: qty 2

## 2021-07-01 NOTE — Discharge Instructions (Addendum)
Continue taking Tylenol Motrin for headache and body aches. Continue drinking fluids and eating healthy. If you test positive for COVID you will show up on MyChart, please check for the results. You may use the Tessalon Perles every 8 hours as needed for cough symptoms. Symptoms may take up to 2 weeks to fully resolve, please schedule a follow-up appointment with your primary care doctor in 2 weeks for reevaluation of symptoms are still present. I written you a work note until Monday, if you are feeling better and tested negative for COVID he can return back to work sooner if you desire.

## 2021-07-01 NOTE — ED Triage Notes (Signed)
C/o cough fever , h/a , body aches , x 3 days , neg home covid test

## 2021-07-01 NOTE — ED Provider Notes (Signed)
[facility] EMERGENCY DEPARTMENT Provider Note   CSN: [id number] Arrival date & time: 07/01/21  1646     History Chief Complaint  Patient presents with   Cough    Carrie Mcdonald is a 35 y.o. female.   Cough Associated symptoms: fever, headaches and myalgias   Associated symptoms: no shortness of breath    Patient presents with cough x3 days.  There is also associated headache, congestion, fever, body aches.  She has tried Tylenol with some relief.  States her daughter had similar symptoms last week.  No other known sick contacts.  Patient is COVID vaccinated and boosted.  She has been drinking plenty of fluids which helps improve the symptoms, unknown aggravating factors.  She is not short of breath, not having any chest pains.  History reviewed. No pertinent past medical history.  Patient Active Problem List   Diagnosis Date Noted   LEG PAIN, RIGHT 10/28/2010    Past Surgical History:  Procedure Laterality Date   TUBAL LIGATION     Essure     OB History     Gravida  3   Para  3   Term  3   Preterm  0   AB  0   Living  0      SAB  0   IAB  0   Ectopic  0   Multiple  0   Live Births  3           No family history on file.  Social History   Tobacco Use   Smoking status: Never   Smokeless tobacco: Never  Vaping Use   Vaping Use: Never used  Substance Use Topics   Alcohol use: Yes    Comment: social   Drug use: No    Home Medications Prior to Admission medications   Medication Sig Start Date End Date Taking? Authorizing Provider  benzonatate (TESSALON) 100 MG capsule Take 1 capsule (100 mg total) by mouth every 8 (eight) hours. 07/01/21  Yes Theron Arista, PA-C  doxycycline (VIBRAMYCIN) 100 MG capsule Take 1 capsule (100 mg total) by mouth 2 (two) times daily. 07/24/18   Rise Mu, PA-C  metroNIDAZOLE (FLAGYL) 500 MG tablet Take 1 tablet (500 mg total) by mouth 2 (two) times daily with a meal. DO NOT CONSUME ALCOHOL  WHILE TAKING THIS MEDICATION. 07/24/18   Rise Mu, PA-C  naproxen (NAPROSYN) 500 MG tablet Take 1 tablet (500 mg total) by mouth 2 (two) times daily. 05/14/17   Derwood Kaplan, MD  ondansetron (ZOFRAN ODT) 4 MG disintegrating tablet Take 1 tablet (4 mg total) by mouth every 8 (eight) hours as needed for nausea or vomiting. 07/24/18   Leaphart, Lynann Beaver, PA-C  ondansetron (ZOFRAN) 4 MG tablet Take 1 tablet (4 mg total) by mouth every 6 (six) hours. 07/09/14   MabeLatanya Maudlin, MD  prenatal vitamin w/FE, FA (PRENATAL 1 + 1) 27-1 MG TABS Take 1 tablet by mouth daily.      [provider]    Allergies    Penicillins  Review of Systems   Review of Systems  Constitutional:  Positive for fever.  HENT:  Positive for congestion.   Respiratory:  Positive for cough. Negative for shortness of breath.   Gastrointestinal:  Negative for diarrhea, nausea and vomiting.  Musculoskeletal:  Positive for myalgias.  Neurological:  Positive for headaches.   Physical Exam Updated Vital Signs BP (!) 127/95 (BP Location: Right Arm)   Pulse  91   Temp 99.9 F (37.7 C) (Oral)   Resp 18   Ht 5\' 2"  (1.575 m)   Wt 52.1 kg   LMP 06/19/2021   SpO2 100%   BMI 21.02 kg/m   Physical Exam Vitals and nursing note reviewed. Exam conducted with a chaperone present.  Constitutional:      General: She is not in acute distress.    Appearance: Normal appearance.  HENT:     Head: Normocephalic and atraumatic.     Nose: Congestion present.     Mouth/Throat:     Pharynx: Posterior oropharyngeal erythema present.  Eyes:     General: No scleral icterus.    Extraocular Movements: Extraocular movements intact.     Pupils: Pupils are equal, round, and reactive to light.  Cardiovascular:     Rate and Rhythm: Normal rate and regular rhythm.  Pulmonary:     Effort: Pulmonary effort is normal.     Breath sounds: Normal breath sounds.     Comments: Lungs are clear to auscultation bilaterally.  No accessory  muscle use, no tachypnea Skin:    Coloration: Skin is not jaundiced.  Neurological:     Mental Status: She is alert. Mental status is at baseline.     Coordination: Coordination normal.    ED Results / Procedures / Treatments   Labs (all labs ordered are listed, but only abnormal results are displayed) Labs Reviewed  SARS CORONAVIRUS 2 (TAT 6-24 HRS)    EKG None  Radiology No results found.  Procedures Procedures   Medications Ordered in ED Medications  acetaminophen (TYLENOL) tablet 1,000 mg (1,000 mg Oral Given 07/01/21 1737)    ED Course  I have reviewed the triage vital signs and the nursing notes.  Pertinent labs & imaging results that were available during my care of the patient were reviewed by me and considered in my medical decision making (see chart for details).    MDM Rules/Calculators/A&P                           Patient vitals are stable, she has viral symptoms for 3 days.  She did have a fever and mild tachycardia on intake, both improved with Tylenol.  She denies any chest pain or shortness of breath, no hypoxia or respiratory distress.  Lungs are clear to auscultation, very low suspicion for pneumonia especially given the low amount of days she has been symptomatic.  Additionally her daughter just had a viral illness.  We will test her for COVID and flu and give her some Tylenol for her fever here in the ED setting.  Follow-up instructions given, patient advised to follow-up with her primary care doctor if not improved in the next week or so.  At this time she is appropriate for discharge, her symptoms appear to be viral URI.  Final Clinical Impression(s) / ED Diagnoses Final diagnoses:  Upper respiratory tract infection, unspecified type    Rx / DC Orders ED Discharge Orders          Ordered    benzonatate (TESSALON) 100 MG capsule  Every 8 hours        07/01/21 1750             08/31/21, PA-C 07/01/21 1758    08/31/21,  MD 07/12/21 1746

## 2021-07-02 LAB — SARS CORONAVIRUS 2 (TAT 6-24 HRS): SARS Coronavirus 2: POSITIVE — AB

## 2023-03-24 ENCOUNTER — Emergency Department (HOSPITAL_BASED_OUTPATIENT_CLINIC_OR_DEPARTMENT_OTHER)
Admission: EM | Admit: 2023-03-24 | Discharge: 2023-03-24 | Disposition: A | Discharge status: Home / Self Care | Payer: Medicaid Other | Attending: Emergency Medicine | Admitting: Emergency Medicine

## 2023-03-24 ENCOUNTER — Encounter (HOSPITAL_BASED_OUTPATIENT_CLINIC_OR_DEPARTMENT_OTHER): Payer: Self-pay

## 2023-03-24 ENCOUNTER — Other Ambulatory Visit: Payer: Self-pay

## 2023-03-24 DIAGNOSIS — M549 Dorsalgia, unspecified: Secondary | ICD-10-CM | POA: Insufficient documentation

## 2023-03-24 DIAGNOSIS — Y92410 Unspecified street and highway as the place of occurrence of the external cause: Secondary | ICD-10-CM | POA: Insufficient documentation

## 2023-03-24 DIAGNOSIS — Z91040 Latex allergy status: Secondary | ICD-10-CM | POA: Diagnosis not present

## 2023-03-24 DIAGNOSIS — M7918 Myalgia, other site: Secondary | ICD-10-CM

## 2023-03-24 DIAGNOSIS — M545 Low back pain, unspecified: Secondary | ICD-10-CM | POA: Insufficient documentation

## 2023-03-24 MED ORDER — CYCLOBENZAPRINE HCL 10 MG PO TABS
10.0000 mg | ORAL_TABLET | Freq: Two times a day (BID) | ORAL | 0 refills | Status: DC | PRN
  Filled 2023-03-24: qty 20, 10d supply, fill #0

## 2023-03-24 MED ORDER — CYCLOBENZAPRINE HCL 10 MG PO TABS: 10.0000 mg | ORAL_TABLET | Freq: Two times a day (BID) | ORAL | 0 refills | Status: AC | PRN

## 2023-03-24 NOTE — Discharge Instructions (Addendum)
Please take tylenol/ibuprofen and Flexeril for pain. I recommend close follow-up with PCP for reevaluation.  Please do not hesitate to return to emergency department if worrisome signs symptoms we discussed become apparent. 
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
## 2023-03-25 ENCOUNTER — Other Ambulatory Visit (HOSPITAL_BASED_OUTPATIENT_CLINIC_OR_DEPARTMENT_OTHER): Payer: Self-pay

## 2023-05-21 ENCOUNTER — Other Ambulatory Visit: Payer: Self-pay

## 2023-05-21 ENCOUNTER — Emergency Department (HOSPITAL_BASED_OUTPATIENT_CLINIC_OR_DEPARTMENT_OTHER)
Admission: EM | Admit: 2023-05-21 | Discharge: 2023-05-21 | Disposition: A | Discharge status: Home / Self Care | Payer: Medicaid Other | Attending: Emergency Medicine | Admitting: Emergency Medicine

## 2023-05-21 ENCOUNTER — Encounter (HOSPITAL_BASED_OUTPATIENT_CLINIC_OR_DEPARTMENT_OTHER): Payer: Self-pay | Admitting: Emergency Medicine

## 2023-05-21 DIAGNOSIS — U071 COVID-19: Secondary | ICD-10-CM | POA: Insufficient documentation

## 2023-05-21 DIAGNOSIS — R051 Acute cough: Secondary | ICD-10-CM

## 2023-05-21 LAB — SARS CORONAVIRUS 2 BY RT PCR: SARS Coronavirus 2 by RT PCR: POSITIVE — AB

## 2023-05-21 MED ORDER — PAXLOVID (300/100) 20 X 150 MG & 10 X 100MG PO TBPK
3.0000 | ORAL_TABLET | Freq: Two times a day (BID) | ORAL | 0 refills | Status: DC
  Filled 2023-05-21: qty 30, 5d supply, fill #0

## 2023-05-21 MED ORDER — PAXLOVID (300/100) 20 X 150 MG & 10 X 100MG PO TBPK: 3.0000 | ORAL_TABLET | Freq: Two times a day (BID) | ORAL | 0 refills | Status: AC

## 2023-05-21 NOTE — Discharge Instructions (Addendum)
You tested positive for covid today. Take tylenol/ibuprofen for pain. I recommend close follow-up with PCP for reevaluation.  Please do not hesitate to return to emergency department if worrisome signs symptoms we discussed become apparent.

## 2023-05-21 NOTE — ED Triage Notes (Signed)
Pt with cough, congestion, body aches x 2d; sts she wants to r/o Covid

## 2023-05-21 NOTE — ED Provider Notes (Signed)
[place] [facility]GENCY DEPARTMENT AT [facility] [facility] Provider Note   CSN: [id number] Arrival date & time: 05/21/23  2106     History  Chief Complaint  Patient presents with   Cough    Carrie Mcdonald is a 37 y.o. female otherwise healthy presents today for evaluation of flulike symptoms.  Patient states she has had cough, congestion, body aches for 2 days.  No known sick contacts.  She wants to be tested for COVID.  Denies chest pain, shortness of breath, nausea, vomiting, bowel change, urinary symptoms.  She checked her temperature yesterday which was found to be 100.   Cough    History reviewed. No pertinent past medical history. Past Surgical History:  Procedure Laterality Date   TUBAL LIGATION     Essure     Home Medications Prior to Admission medications   Medication Sig Start Date End Date Taking? Authorizing Provider  nirmatrelvir & ritonavir (PAXLOVID, 300/100,) 20 x 150 MG & 10 x 100MG  TBPK Take 3 tablets by mouth 2 (two) times daily for 5 days. 05/21/23 05/26/23 Yes Jeanelle Malling, PA  benzonatate (TESSALON) 100 MG capsule Take 1 capsule (100 mg total) by mouth every 8 (eight) hours. 07/01/21   Theron Arista, PA-C  cyclobenzaprine (FLEXERIL) 10 MG tablet Take 1 tablet (10 mg total) by mouth 2 (two) times daily as needed for muscle spasms. 03/24/23   Jeanelle Malling, PA  doxycycline (VIBRAMYCIN) 100 MG capsule Take 1 capsule (100 mg total) by mouth 2 (two) times daily. 07/24/18   Rise Mu, PA-C  metroNIDAZOLE (FLAGYL) 500 MG tablet Take 1 tablet (500 mg total) by mouth 2 (two) times daily with a meal. DO NOT CONSUME ALCOHOL WHILE TAKING THIS MEDICATION. 07/24/18   Rise Mu, PA-C  naproxen (NAPROSYN) 500 MG tablet Take 1 tablet (500 mg total) by mouth 2 (two) times daily. 05/14/17   Derwood Kaplan, MD  ondansetron (ZOFRAN ODT) 4 MG disintegrating tablet Take 1 tablet (4 mg total) by mouth every 8 (eight) hours as needed for nausea or vomiting. 07/24/18   Leaphart,  Lynann Beaver, PA-C  ondansetron (ZOFRAN) 4 MG tablet Take 1 tablet (4 mg total) by mouth every 6 (six) hours. 07/09/14   MabeLatanya Maudlin, MD  prenatal vitamin w/FE, FA (PRENATAL 1 + 1) 27-1 MG TABS Take 1 tablet by mouth daily.      [provider]      Allergies    Penicillins and Latex    Review of Systems   Review of Systems  Respiratory:  Positive for cough.     Physical Exam Updated Vital Signs BP (!) 165/100   Pulse 80   Temp 98.5 F (36.9 C)   Resp 20   Ht 5\' 1"  (1.549 m)   Wt 57.6 kg   LMP 04/27/2023   SpO2 100%   BMI 24.00 kg/m  Physical Exam Vitals and nursing note reviewed.  Constitutional:      Appearance: Normal appearance.  HENT:     Head: Normocephalic and atraumatic.     Mouth/Throat:     Mouth: Mucous membranes are moist.  Eyes:     General: No scleral icterus. Cardiovascular:     Rate and Rhythm: Normal rate and regular rhythm.     Pulses: Normal pulses.     Heart sounds: Normal heart sounds.  Pulmonary:     Effort: Pulmonary effort is normal.     Breath sounds: Normal breath sounds.  Abdominal:  General: Abdomen is flat.     Palpations: Abdomen is soft.     Tenderness: There is no abdominal tenderness.  Musculoskeletal:        General: No deformity.  Skin:    General: Skin is warm.     Findings: No rash.  Neurological:     General: No focal deficit present.     Mental Status: She is alert.  Psychiatric:        Mood and Affect: Mood normal.     ED Results / Procedures / Treatments   Labs (all labs ordered are listed, but only abnormal results are displayed) Labs Reviewed  SARS CORONAVIRUS 2 BY RT PCR - Abnormal; Notable for the following components:      Result Value   SARS Coronavirus 2 by RT PCR POSITIVE (*)    All other components within normal limits    EKG None  Radiology No results found.  Procedures Procedures    Medications Ordered in ED Medications - No data to display  ED Course/ Medical Decision  Making/ A&P                             Medical Decision Making Risk Prescription drug management.   This patient presents to the ED for cough, fever, sore throat, body aches, this involves an extensive number of treatment options, and is a complaint that carries with a high risk of complications and morbidity.  The differential diagnosis includes flu, COVID, RSV, strep, pharyngitis, bronchitis, pneumonia, infectious etiology.  This is not an exhaustive list.  Lab tests: I ordered and personally interpreted labs.  The pertinent results include: Viral panel positive for COVID.  Imaging studies:  Problem list/ ED course/ Critical interventions/ Medical management: HPI: See above Vital signs within normal range and stable throughout visit. Laboratory/imaging studies significant for: See above. On physical examination, patient is afebrile and appears in no acute distress. This patient presents with symptoms suspicious for flu. Based on history and physical doubt sinusitis. COVID test was negative. Do not suspect underlying cardiopulmonary process. I considered, but think unlikely, pneumonia. Patient is nontoxic appearing and not in need of emergent medical intervention. Patient told to self isolate at home until symptoms subside for 72 hours. Recommended patient to take TheraFlu or Mucinex for symptom relief.  Follow-up with primary care physician for further evaluation and management.  Return to the ER if new or worsening symptoms. I have reviewed the patient home medicines and have made adjustments as needed.  Cardiac monitoring/EKG: The patient was maintained on a cardiac monitor.  I personally reviewed and interpreted the cardiac monitor which showed an underlying rhythm of: sinus rhythm.  Additional history obtained: External records from outside source obtained and reviewed including: Chart review including previous notes, labs, imaging.  Consultations  obtained:  Disposition Continued outpatient therapy. Follow-up with PCP recommended for reevaluation of symptoms. Treatment plan discussed with patient.  Pt acknowledged understanding was agreeable to the plan. Worrisome signs and symptoms were discussed with patient, and patient acknowledged understanding to return to the ED if they noticed these signs and symptoms. Patient was stable upon discharge.   This chart was dictated using voice recognition software.  Despite best efforts to proofread,  errors can occur which can change the documentation meaning.           Final Clinical Impression(s) / ED Diagnoses Final diagnoses:  COVID  Acute cough    Rx /  DC Orders ED Discharge Orders          Ordered    nirmatrelvir & ritonavir (PAXLOVID, 300/100,) 20 x 150 MG & 10 x 100MG  TBPK  2 times daily        05/21/23 2159              Jeanelle Malling, [place] 05/21/23 2219    Rolan Bucco, MD 05/21/23 2235

## 2023-05-22 ENCOUNTER — Other Ambulatory Visit (HOSPITAL_BASED_OUTPATIENT_CLINIC_OR_DEPARTMENT_OTHER): Payer: Self-pay
